# Patient Record
Sex: Female | Born: 1960 | Race: Black or African American | Hispanic: No | Smoking: Never smoker
Health system: Southern US, Community
[De-identification: ages and names within clinical notes are randomized; demographics above are authoritative.]

## PROBLEM LIST (undated history)

## (undated) DIAGNOSIS — M329 Systemic lupus erythematosus, unspecified: Secondary | ICD-10-CM

## (undated) DIAGNOSIS — E119 Type 2 diabetes mellitus without complications: Secondary | ICD-10-CM

## (undated) DIAGNOSIS — IMO0002 Reserved for concepts with insufficient information to code with codable children: Secondary | ICD-10-CM

---

## 2004-08-12 ENCOUNTER — Other Ambulatory Visit: Payer: Self-pay

## 2005-03-01 ENCOUNTER — Emergency Department: Payer: Self-pay | Admitting: Emergency Medicine

## 2005-04-04 ENCOUNTER — Emergency Department: Payer: Self-pay | Admitting: Internal Medicine

## 2005-06-17 ENCOUNTER — Emergency Department: Payer: Self-pay | Admitting: Internal Medicine

## 2005-07-27 ENCOUNTER — Emergency Department: Payer: Self-pay | Admitting: Emergency Medicine

## 2006-02-27 ENCOUNTER — Emergency Department: Payer: Self-pay | Admitting: Unknown Physician Specialty

## 2006-11-19 ENCOUNTER — Emergency Department: Payer: Self-pay | Admitting: Emergency Medicine

## 2007-01-20 ENCOUNTER — Emergency Department: Payer: Self-pay | Admitting: Emergency Medicine

## 2007-02-12 ENCOUNTER — Ambulatory Visit: Payer: Self-pay | Admitting: Unknown Physician Specialty

## 2007-04-13 ENCOUNTER — Ambulatory Visit: Payer: Self-pay | Admitting: Ophthalmology

## 2007-06-30 ENCOUNTER — Ambulatory Visit: Payer: Self-pay | Admitting: Ophthalmology

## 2007-09-06 ENCOUNTER — Emergency Department: Payer: Self-pay | Admitting: Emergency Medicine

## 2007-09-11 ENCOUNTER — Emergency Department: Payer: Self-pay

## 2008-05-24 ENCOUNTER — Emergency Department: Payer: Self-pay | Admitting: Emergency Medicine

## 2009-09-22 ENCOUNTER — Emergency Department: Payer: Self-pay | Admitting: Emergency Medicine

## 2010-02-01 ENCOUNTER — Emergency Department: Payer: Self-pay | Admitting: Emergency Medicine

## 2013-04-26 ENCOUNTER — Ambulatory Visit: Payer: Self-pay

## 2016-08-23 ENCOUNTER — Emergency Department
Admission: EM | Admit: 2016-08-23 | Discharge: 2016-08-24 | Disposition: A | Discharge status: Home / Self Care | Payer: Medicare Other | Attending: Orthopedic Surgery | Admitting: Orthopedic Surgery

## 2016-08-23 ENCOUNTER — Emergency Department: Payer: Medicare Other

## 2016-08-23 DIAGNOSIS — W1789XA Other fall from one level to another, initial encounter: Secondary | ICD-10-CM | POA: Diagnosis not present

## 2016-08-23 DIAGNOSIS — S82402A Unspecified fracture of shaft of left fibula, initial encounter for closed fracture: Secondary | ICD-10-CM | POA: Insufficient documentation

## 2016-08-23 DIAGNOSIS — S8992XA Unspecified injury of left lower leg, initial encounter: Secondary | ICD-10-CM | POA: Diagnosis present

## 2016-08-23 DIAGNOSIS — S82302A Unspecified fracture of lower end of left tibia, initial encounter for closed fracture: Secondary | ICD-10-CM | POA: Insufficient documentation

## 2016-08-23 DIAGNOSIS — Y929 Unspecified place or not applicable: Secondary | ICD-10-CM | POA: Insufficient documentation

## 2016-08-23 DIAGNOSIS — Y9389 Activity, other specified: Secondary | ICD-10-CM | POA: Diagnosis not present

## 2016-08-23 DIAGNOSIS — Y999 Unspecified external cause status: Secondary | ICD-10-CM | POA: Diagnosis not present

## 2016-08-23 DIAGNOSIS — S82209A Unspecified fracture of shaft of unspecified tibia, initial encounter for closed fracture: Secondary | ICD-10-CM

## 2016-08-23 DIAGNOSIS — S82392A Other fracture of lower end of left tibia, initial encounter for closed fracture: Secondary | ICD-10-CM

## 2016-08-23 MED ORDER — HYDROCODONE-ACETAMINOPHEN 5-325 MG PO TABS
1.0000 | ORAL_TABLET | ORAL | Status: AC
  Administered 2016-08-23: 1 via ORAL
  Filled 2016-08-23: qty 1

## 2016-08-23 NOTE — ED Notes (Signed)
Patient returned to lobby at this time. Tech advising that preliminary results were (+) for a fracture. Patient to be triaged and taken to a room for immediate evaluation and treatment.

## 2016-08-23 NOTE — ED Notes (Signed)
Patient transported to CT 

## 2016-08-23 NOTE — ED Notes (Signed)
Patient to radiology at this time.

## 2016-08-23 NOTE — ED Notes (Signed)
Pt returned from ct scan

## 2016-08-23 NOTE — ED Notes (Signed)
Pt placed in strecher, left leg elevated. Pt with left medial deformity noted to left ankle. Ecchymosis noted, edema noted. Pt states injury happened 6 days pta. Pt able to move all toes. 2+ pedal pulses noted. Pt states took pain medication at 1400 today.

## 2016-08-23 NOTE — Consult Note (Signed)
ORTHOPAEDIC CONSULTATION  REQUESTING PHYSICIAN: Connie Fairly, MD  Chief Complaint: Left ankle injury status post fall  HPI: Connie Brewer is a 55 y.o. female who is seen in the ER today with her daughter. Patient sustained a fall last Saturday at home. She has been using a walker for assistance with ambulation since her injury. She denies any numbness or tingling.  No past medical history on file. No past surgical history on file. Social History   Social History  . Marital status: Widowed    Spouse name: N/A  . Number of children: N/A  . Years of education: N/A   Social History Main Topics  . Smoking status: Not on file  . Smokeless tobacco: Not on file  . Alcohol use Not on file  . Drug use: Unknown  . Sexual activity: Not on file   Other Topics Concern  . Not on file   Social History Narrative  . No narrative on file   No family history on file. Allergies not on file Prior to Admission medications   Not on File   Dg Ankle Complete Left  Result Date: 08/23/2016 CLINICAL DATA:  Patient presents with c/o pain and swelling to her LEFT ankle following a fall that she experienced on Saturday or Sunday. EXAM: LEFT ANKLE COMPLETE - 3+ VIEW COMPARISON:  None. FINDINGS: There are displaced fractures of the distal tibia Cant of the distal fibular shaft. The distal tibia fracture is comminuted with fracture components intersecting the articular surface of the distal tibia. The primary fracture component is oblique from the lateral meta diaphysis to the medial metaphysis. The primary distal fracture component has displaced laterally by 16 mm. It is also angulated laterally. The distal fibular fracture is non comminuted, transverse, but also displaced being foreshortened/overlapped by 14 mm as well as mildly displaced anteriorly by 7 mm and angulated posteriorly and laterally. The talus remains normally aligned with the tibial epiphysis. There is diffuse surrounding soft tissue  swelling. IMPRESSION: 1. Comminuted displaced fracture of the distal tibia with a transverse mildly displaced fracture of the distal fibular shaft. No ankle dislocation. Electronically Signed   By: Amie Portland M.D.   On: 08/23/2016 19:59   Ct Ankle Left Wo Contrast  Result Date: 08/23/2016 CLINICAL DATA:  Left ankle pain and swelling after a fall Saturday or Sunday. Left ankle radiographs demonstrate multiple comminuted fractures of the distal left tibia and fibula. EXAM: CT OF THE LEFT ANKLE WITHOUT CONTRAST TECHNIQUE: Multidetector CT imaging of the left ankle was performed according to the standard protocol. Multiplanar CT image reconstructions were also generated. COMPARISON:  Left ankle radiographs 08/23/2016 FINDINGS: Multiple comminuted fractures involving the distal tibial metaphysis with extension of multiple fracture lines to the tibiotalar joint as well as to the tibia fibular joint. There is impaction of the fracture fragments. The talus is also impacted into the fracture fragments with fracture fragments extending along the lateral aspect of the talar dome. The talus appears intact Oblique fracture of the distal fibular shaft with tiny butterfly fragments demonstrated. There is complete medial displacement and about 2.1 cm overriding of the distal fracture fragment with posterior angulation of the distal fracture fragment. Diffuse soft tissue swelling about the ankle. Visualized portions of the tarsal bones appear intact without evidence of fracture or dislocation. Old ununited ossicles posterior to the talus likely representing accessory spleens. IMPRESSION: Multiple comminuted fractures of the distal tibial metaphysis with impaction of fracture fragments. Multiple fracture lines extend to the tibiotalar joint  and to the tibia fibular joint. Oblique fracture of the distal fibular shaft medial displacement and overriding of the distal fracture fragment as well as posterior angulation of the distal  fracture fragment. Electronically Signed   By: Burman NievesWilliam  Stevens M.D.   On: 08/23/2016 22:02    Positive ROS: All other systems have been reviewed and were otherwise negative with the exception of those mentioned in the HPI and as above.  Physical Exam: General: Alert, no acute distress  MUSCULOSKELETAL: Left ankle: Patient has diffuse swelling and ecchymosis over the left foot and ankle. She has a slight valgus deformity to the ankle. Patient's skin is intact. She has tenderness over the distal tibia and fibula. She can flex and extend her toes without pain and gently dorsiflex and plantarflex her ankle with pain. Her foot and leg compartments are soft and compressible.  Assessment: Comminuted distal tibia and fibula fracture, closed  Plan: I explained to the patient and her daughter that she has sustained a significant injury to the left lower leg. She understands that the distal tibia is comminuted and extends almost to the ankle joint. There is valgus deformity to the ankle. Patient's skin is swollen as this injury is now 796 days old. Fortunately she remains neurovascularly intact. There is no evidence of compartment syndrome. I explained that I expect her treatment to involve 2 surgeries. The first surgery would be to place an external fixator in the second definitive surgery would involve open reduction internal fixation once her swelling has subsided. Given the comminuted nature of her fracture and the distal extension of the fracture, I would recommend getting her to see a traumatologist for definitive fixation.  The patient's daughter explains that she [place] [employer]. She starts school on Monday. Since she will be the primary caretaker of her mother she is requesting that her mother be transferred for her care closer [facility] the [facility] [employer]. [facility]. I explained this to the ER staff. They will attempt to arrange for  [facility] Mrs. Cubit. The patient is transferred she should be placed in an AO splint before transfer to stabilize her fracture. I discussed this plan with Thayer Ohmhris, the orthopaedic PA working with this patient in the ER tonight.    Connie FairlyKRASINSKI, Mailyn Steichen, MD    08/23/2016 11:14 PM

## 2016-08-23 NOTE — ED Notes (Signed)
FIRST NURSE NOTE: Patient presents with c/o pain and swelling to her LEFT ankle following a fall that she experienced on Saturday or Sunday. (+) PMS noted; foot warm and dry; cap refill WNL as assessed by this RN at time of check in. Diagnostic plain film of LEFT ankle ordered.

## 2016-08-24 MED ORDER — HYDROCODONE-ACETAMINOPHEN 5-325 MG PO TABS: 1.0000 | ORAL_TABLET | Freq: Four times a day (QID) | ORAL | 0 refills | Status: AC | PRN

## 2016-08-24 NOTE — ED Notes (Signed)
Pt updated on progress of treatment plan. Pt verbalizes understanding. Call bell at side, warm blankets intact. Assessment of left lower extremity unchanged from previous assessment.

## 2016-08-24 NOTE — ED Notes (Signed)
Chris gaines, pa in to apply splint to ankle.

## 2016-08-24 NOTE — Discharge Instructions (Signed)
Please call Dr. Andrey CampanileWilson at [facility] first thing Monday morning to schedule follow-up appointment. Please keep the left lower extremity elevated and do not bear weight on the left leg. Please use walker to help with ambulation. Take Norco as needed for pain.

## 2016-08-24 NOTE — ED Notes (Signed)
Orthopedist in to examine pt.

## 2016-08-24 NOTE — ED Provider Notes (Signed)
[facility]-EMERGENCY DEPARTMENT Provider Note   CSN: [id number] Arrival date & time: 08/23/16  1921     History   Chief Complaint No chief complaint on file.   HPI Connie Brewer is a 55 y.o. female presents to the emergency department for evaluation of left ankle pain. Patient fell 6 days ago at her home, she was standing on a bench swatting at a hornet's nest when she fell 3 feet on her left foot rolling her left ankle. She has significant left ankle pain and was unable to bear weight. She was using a friend's walker, icing and elevating the left leg over the last 6 days. Patient's daughter arrived from college today and advised the patient to come to the emergency department. Patient has been managing her pain with Tylenol. She states her pain is currently mild as long as she is not moving the left ankle. Patient denies any numbness or tingling to the lower extremity. She denies any bleeding around the ankle. Patient denies any medical problems such as neuropathy, diabetes. No hypertension, cardiac disease. Patient's medications are Tylenol for left ankle pain.  HPI  No past medical history on file.  There are no active problems to display for this patient.   No past surgical history on file.  OB History    No data available       Home Medications    Prior to Admission medications   Medication Sig Start Date End Date Taking? Authorizing Provider  HYDROcodone-acetaminophen (NORCO) 5-325 MG tablet Take 1 tablet by mouth every 6 (six) hours as needed for moderate pain. 08/24/16   Evon Slack, PA-C    Family History No family history on file.  Social History Social History  Substance Use Topics  . Smoking status: Not on file  . Smokeless tobacco: Not on file  . Alcohol use Not on file     Allergies   Review of patient's allergies indicates not on file.   Review of Systems Review of Systems  Constitutional: Negative for activity change, chills, fatigue and  fever.  HENT: Negative for congestion, sinus pressure and sore throat.   Eyes: Negative for visual disturbance.  Respiratory: Negative for cough, chest tightness and shortness of breath.   Cardiovascular: Negative for chest pain and leg swelling.  Gastrointestinal: Negative for abdominal pain, diarrhea, nausea and vomiting.  Genitourinary: Negative for dysuria.  Musculoskeletal: Positive for arthralgias, gait problem and joint swelling.  Skin: Negative for rash.  Neurological: Negative for dizziness, weakness, numbness and headaches.  Hematological: Negative for adenopathy.  Psychiatric/Behavioral: Negative for agitation, behavioral problems and confusion.     Physical Exam Updated Vital Signs BP 124/65 (BP Location: Right Arm)   Pulse 76   Temp 98.6 F (37 C) (Oral)   Resp 18   Ht 5\' 2"  (1.575 m)   Wt 84.8 kg   SpO2 100%   BMI 34.20 kg/m   Physical Exam  Constitutional: She is oriented to person, place, and time. She appears well-developed and well-nourished. No distress.  HENT:  Head: Normocephalic and atraumatic.  Mouth/Throat: Oropharynx is clear and moist.  Eyes: EOM are normal. Pupils are equal, round, and reactive to light. Right eye exhibits no discharge. Left eye exhibits no discharge.  Neck: Normal range of motion. Neck supple.  Cardiovascular: Normal rate, regular rhythm and intact distal pulses.   Pulmonary/Chest: Effort normal and breath sounds normal. No respiratory distress. She exhibits no tenderness.  Abdominal: Soft. She exhibits no distension. There is no  tenderness.  Musculoskeletal:  Patient is full range of motion of the cervical thoracic and lumbar spine. She is nontender to palpation along the spine. Patient has significant swelling of the left ankle with mild deformity. There is no tenting of the skin, no skin lesions noted. Medial and lateral malleolus is palpable. She is significantly tender throughout the ankle. She has 2+ dorsalis pedis pulse and  sensation is intact distally.  Neurological: She is alert and oriented to person, place, and time. She has normal reflexes.  Skin: Skin is warm and dry.  Psychiatric: She has a normal mood and affect. Her behavior is normal. Thought content normal.  Nursing note and vitals reviewed.    ED Treatments / Results  Labs (all labs ordered are listed, but only abnormal results are displayed) Labs Reviewed - No data to display  EKG  EKG Interpretation None       Radiology Dg Ankle Complete Left  Result Date: 08/23/2016 CLINICAL DATA:  Patient presents with c/o pain and swelling to her LEFT ankle following a fall that she experienced on Saturday or Sunday. EXAM: LEFT ANKLE COMPLETE - 3+ VIEW COMPARISON:  None. FINDINGS: There are displaced fractures of the distal tibia Cant of the distal fibular shaft. The distal tibia fracture is comminuted with fracture components intersecting the articular surface of the distal tibia. The primary fracture component is oblique from the lateral meta diaphysis to the medial metaphysis. The primary distal fracture component has displaced laterally by 16 mm. It is also angulated laterally. The distal fibular fracture is non comminuted, transverse, but also displaced being foreshortened/overlapped by 14 mm as well as mildly displaced anteriorly by 7 mm and angulated posteriorly and laterally. The talus remains normally aligned with the tibial epiphysis. There is diffuse surrounding soft tissue swelling. IMPRESSION: 1. Comminuted displaced fracture of the distal tibia with a transverse mildly displaced fracture of the distal fibular shaft. No ankle dislocation. Electronically Signed   By: Amie Portlandavid  Ormond M.D.   On: 08/23/2016 19:59   Ct Ankle Left Wo Contrast  Result Date: 08/23/2016 CLINICAL DATA:  Left ankle pain and swelling after a fall Saturday or Sunday. Left ankle radiographs demonstrate multiple comminuted fractures of the distal left tibia and fibula. EXAM: CT  OF THE LEFT ANKLE WITHOUT CONTRAST TECHNIQUE: Multidetector CT imaging of the left ankle was performed according to the standard protocol. Multiplanar CT image reconstructions were also generated. COMPARISON:  Left ankle radiographs 08/23/2016 FINDINGS: Multiple comminuted fractures involving the distal tibial metaphysis with extension of multiple fracture lines to the tibiotalar joint as well as to the tibia fibular joint. There is impaction of the fracture fragments. The talus is also impacted into the fracture fragments with fracture fragments extending along the lateral aspect of the talar dome. The talus appears intact Oblique fracture of the distal fibular shaft with tiny butterfly fragments demonstrated. There is complete medial displacement and about 2.1 cm overriding of the distal fracture fragment with posterior angulation of the distal fracture fragment. Diffuse soft tissue swelling about the ankle. Visualized portions of the tarsal bones appear intact without evidence of fracture or dislocation. Old ununited ossicles posterior to the talus likely representing accessory spleens. IMPRESSION: Multiple comminuted fractures of the distal tibial metaphysis with impaction of fracture fragments. Multiple fracture lines extend to the tibiotalar joint and to the tibia fibular joint. Oblique fracture of the distal fibular shaft medial displacement and overriding of the distal fracture fragment as well as posterior angulation of the distal fracture  fragment. Electronically Signed   By: Burman Nieves M.D.   On: 08/23/2016 22:02    Procedures Procedures (including critical care time) SPLINT APPLICATION Date/Time: 12:48 AM Authorized by: Patience Musca Consent: Verbal consent obtained. Risks and benefits: risks, benefits and alternatives were discussed Consent given by: patient Splint applied by: pa Location details: Left ankle  Splint type: Posterior stirrup  Supplies used: Ortho-Glass,  Ace wrap, cast padding.  Post-procedure: The splinted body part was neurovascularly unchanged following the procedure. Patient tolerance: Patient tolerated the procedure well with no immediate complications.    Medications Ordered in ED Medications  HYDROcodone-acetaminophen (NORCO/VICODIN) 5-325 MG per tablet 1 tablet (1 tablet Oral Given 08/23/16 2333)     Initial Impression / Assessment and Plan / ED Course  I have reviewed the triage vital signs and the nursing notes.  Pertinent labs & imaging results that were available during my care of the patient were reviewed by me and considered in my medical decision making (see chart for details).  Clinical Course    55 year old female with displaced comminuted distal tibia and fibula fracture. Fracture is closed and patient is neurovascularly intact. Injury occurred 6 days ago, patient has been resting icing and elevating. Discussed case with orthopedist on call, orthopedist ordered CT scan of the left ankle. Orthopedist discuss treatment options with the patient and her daughter. Patient and daughter requested to have [facility] at [facility], patient's daughter is in school [employer] and would like for mother to be close to her while she is in school. Patient does not have any help at Home. Patient is placed into a posterior stirrup splint. Case was discussed with [facility] orthopedic surgeon Dr. Andrey Campanile. Dr. Andrey Campanile agreed to have the patient come into clinic Monday morning to schedule surgery for next week. Patient is already 6 days out from injury, she is neurovascularly intact with a closed fracture. She is placed into a posterior stirrup splint, will continue to elevate and ice as well as remain nonweightbearing with a walker. Office telephone number was given to patient patient educated on splint care and signs and symptoms to return to the emergency department for such as increased pain numbness. She is given a  prescription for Norco for pain.  Final Clinical Impressions(s) / ED Diagnoses   Final diagnoses:  Closed intra-articular fracture of distal tibia, left, initial encounter  Closed fracture of fibula, shaft, left, initial encounter    New Prescriptions New Prescriptions   HYDROCODONE-ACETAMINOPHEN (NORCO) 5-325 MG TABLET    Take 1 tablet by mouth every 6 (six) hours as needed for moderate pain.     Evon Slack, PA-C 08/24/16 [id number]    Minna Antis, MD 08/26/16 [id number]

## 2016-08-24 NOTE — ED Notes (Signed)
Chris gaines, pa to apply splint to ankle.

## 2016-08-24 NOTE — ED Notes (Signed)
Pt updated on results of ct scan. Pt states "i don't want anything for pain that's gonna make me sleep right now."

## 2016-08-27 ENCOUNTER — Telehealth: Payer: Self-pay | Admitting: Emergency Medicine

## 2016-08-28 NOTE — Telephone Encounter (Signed)
Ms Connie Brewer, pt daughter called me yesterday to arrange pt follow up at [facility].  I spoke with [facility] yesterday.  They called me back with pt appt Aug 31 at 320pm.  I called ms harrison and she is aware of appt.

## 2020-04-25 ENCOUNTER — Emergency Department (HOSPITAL_COMMUNITY): Payer: Medicare Other

## 2020-04-25 ENCOUNTER — Encounter (HOSPITAL_COMMUNITY): Payer: Self-pay

## 2020-04-25 ENCOUNTER — Other Ambulatory Visit: Payer: Self-pay

## 2020-04-25 ENCOUNTER — Emergency Department (HOSPITAL_COMMUNITY)
Admission: EM | Admit: 2020-04-25 | Discharge: 2020-04-25 | Disposition: A | Discharge status: Home / Self Care | Payer: Medicare Other | Attending: Emergency Medicine | Admitting: Emergency Medicine

## 2020-04-25 DIAGNOSIS — R0789 Other chest pain: Secondary | ICD-10-CM | POA: Diagnosis not present

## 2020-04-25 DIAGNOSIS — E119 Type 2 diabetes mellitus without complications: Secondary | ICD-10-CM | POA: Insufficient documentation

## 2020-04-25 DIAGNOSIS — Z79899 Other long term (current) drug therapy: Secondary | ICD-10-CM | POA: Diagnosis not present

## 2020-04-25 DIAGNOSIS — M3210 Systemic lupus erythematosus, organ or system involvement unspecified: Secondary | ICD-10-CM | POA: Diagnosis not present

## 2020-04-25 DIAGNOSIS — R079 Chest pain, unspecified: Secondary | ICD-10-CM

## 2020-04-25 DIAGNOSIS — Z7984 Long term (current) use of oral hypoglycemic drugs: Secondary | ICD-10-CM | POA: Insufficient documentation

## 2020-04-25 HISTORY — DX: Reserved for concepts with insufficient information to code with codable children: IMO0002

## 2020-04-25 HISTORY — DX: Systemic lupus erythematosus, unspecified: M32.9

## 2020-04-25 HISTORY — DX: Type 2 diabetes mellitus without complications: E11.9

## 2020-04-25 LAB — CBC
HCT: 44.8 % (ref 36.0–46.0)
Hemoglobin: 14.6 g/dL (ref 12.0–15.0)
MCH: 32.2 pg (ref 26.0–34.0)
MCHC: 32.6 g/dL (ref 30.0–36.0)
MCV: 98.7 fL (ref 80.0–100.0)
Platelets: 384 10*3/uL (ref 150–400)
RBC: 4.54 MIL/uL (ref 3.87–5.11)
RDW: 14 % (ref 11.5–15.5)
WBC: 11.7 10*3/uL — ABNORMAL HIGH (ref 4.0–10.5)
nRBC: 0 % (ref 0.0–0.2)

## 2020-04-25 LAB — BASIC METABOLIC PANEL
Anion gap: 10 (ref 5–15)
BUN: 18 mg/dL (ref 6–20)
CO2: 23 mmol/L (ref 22–32)
Calcium: 9.1 mg/dL (ref 8.9–10.3)
Chloride: 103 mmol/L (ref 98–111)
Creatinine, Ser: 0.73 mg/dL (ref 0.44–1.00)
GFR calc Af Amer: >60 mL/min (ref >60)
GFR calc non Af Amer: >60 mL/min (ref >60)
Glucose, Bld: 269 mg/dL — ABNORMAL HIGH (ref 70–99)
Potassium: 4.6 mmol/L (ref 3.5–5.1)
Sodium: 136 mmol/L (ref 135–145)

## 2020-04-25 LAB — TROPONIN I (HIGH SENSITIVITY)
Troponin I (High Sensitivity): <2 ng/L (ref <18)
Troponin I (High Sensitivity): 3 ng/L (ref <18)

## 2020-04-25 LAB — D-DIMER, QUANTITATIVE (NOT AT ARMC): D-Dimer, Quant: 0.27 ug/mL-FEU (ref 0.00–0.50)

## 2020-04-25 LAB — LIPASE, BLOOD: Lipase: 14 U/L (ref 11–51)

## 2020-04-25 MED ORDER — NITROGLYCERIN 0.4 MG SL SUBL
0.4000 mg | SUBLINGUAL_TABLET | SUBLINGUAL | Status: DC | PRN
  Administered 2020-04-25: 0.4 mg via SUBLINGUAL
  Filled 2020-04-25: qty 1

## 2020-04-25 MED ORDER — KETOROLAC TROMETHAMINE 30 MG/ML IJ SOLN
15.0000 mg | Freq: Once | INTRAMUSCULAR | Status: AC
Start: 2020-04-25 — End: 2020-04-25
  Administered 2020-04-25: 15 mg via INTRAVENOUS
  Filled 2020-04-25: qty 1

## 2020-04-25 MED ORDER — SODIUM CHLORIDE 0.9% FLUSH
3.0000 mL | Freq: Once | INTRAVENOUS | Status: AC
  Administered 2020-04-25: 3 mL via INTRAVENOUS

## 2020-04-25 NOTE — Discharge Instructions (Addendum)

## 2020-04-25 NOTE — ED Notes (Signed)
Called main lab, spoke with [facility] who is able to add on lipase to blood in lab.

## 2020-04-25 NOTE — ED Triage Notes (Signed)
Patient arrived stating she had a covid-19 vaccine today. States she feels like something is stuck in her throat. Complaints of central chest pain that radiates to her back. Declines any shortness of breath or dizziness.

## 2020-04-25 NOTE — ED Provider Notes (Signed)
[facility]-EMERGENCY DEPT Provider Note   CSN: [id number] Arrival date & time: 04/25/20  0320     History Chief Complaint  Patient presents with  . Chest Pain    After Covid-19 vaccine    KERIE BADGER is a 59 y.o. female.  HPI  HPI: A 59 year old patient with a history of treated diabetes and obesity presents for evaluation of chest pain. Initial onset of pain was more than 6 hours ago. The patient's chest pain is sharp and is not worse with exertion. The patient's chest pain is middle- or left-sided, is not well-localized, is not described as heaviness/pressure/tightness and does radiate to the arms/jaw/neck. The patient does not complain of nausea and denies diaphoresis. The patient has smoked in the past 90 days. The patient has no history of stroke, has no history of peripheral artery disease, has no relevant family history of coronary artery disease (first degree relative at less than age 39), is not hypertensive and has no history of hypercholesterolemia.   Patient has history of diabetes and lupus.  She denies any underlying lung disease, cardiac disease.  She reports that her pain started within few hours of her vaccine.  The pain is central and left-sided, it radiates to the back.  Pain has been constant since its onset and it is throbbing, with no specific evoking, aggravating or relieving factors.  Pain woke her up in the middle of the sleep which is why she came to the ER.  Patient also reports that she feels like something is stuck at the base of her throat.  She has no known history of allergies.  Pt has no hx of PE, DVT and denies any exogenous hormone (testosterone / estrogen) use, long distance travels or surgery in the past 6 weeks, active cancer, recent immobilization.   Past Medical History:  Diagnosis Date  . Diabetes mellitus without complication (HCC)   . Lupus (HCC)     There are no problems to display for this patient.    OB  History   No obstetric history on file.     No family history on file.  Social History   Tobacco Use  . Smoking status: Not on file  Substance Use Topics  . Alcohol use: Not on file  . Drug use: Not on file    Home Medications Prior to Admission medications   Medication Sig Start Date End Date Taking? Authorizing Provider  baclofen (LIORESAL) 10 MG tablet Take 10 mg by mouth 3 (three) times daily. 04/11/20  Yes [provider]  Biotin 10 MG TABS Take 10 mg by mouth daily.   Yes [provider]  buPROPion (WELLBUTRIN XL) 300 MG 24 hr tablet Take 300 mg by mouth daily. 02/26/20  Yes [provider]  clobetasol (TEMOVATE) 0.05 % external solution Apply 1 application topically in the morning and at bedtime. Until improved 07/16/19 07/15/20 Yes [provider]  diclofenac Sodium (VOLTAREN) 1 % GEL Apply 2 g topically 4 (four) times daily. 02/18/20  Yes [provider]  lidocaine (LIDODERM) 5 % Place 1 patch onto the skin every 12 (twelve) hours. 10/26/19 10/25/20 Yes [provider]  metFORMIN (GLUCOPHAGE-XR) 500 MG 24 hr tablet Take 1,000 mg by mouth 2 (two) times daily. 11/30/19  Yes [provider]  Multiple Vitamin (MULTIVITAMIN) tablet Take 1 tablet by mouth daily.   Yes [provider]  Multiple Vitamins-Minerals (WOMENS BONE HEALTH PO) Take 1 tablet by mouth daily.  Yes [provider]  naproxen (NAPROSYN) 500 MG tablet Take 500 mg by mouth 2 (two) times daily. 03/30/20  Yes [provider]  nortriptyline (PAMELOR) 25 MG capsule Take 50 mg by mouth at bedtime. 04/04/20  Yes [provider]  NUCYNTA ER 100 MG 12 hr tablet Take 100 mg by mouth every 12 (twelve) hours. 03/30/20  Yes [provider]  pregabalin (LYRICA) 150 MG capsule Take 150 mg by mouth 2 (two) times daily. 03/20/20  Yes [provider]  topiramate (TOPAMAX) 25 MG tablet Take 25 mg by mouth daily.   Yes [provider]  traZODone (DESYREL) 50 MG tablet Take 50 mg by mouth at bedtime as needed for sleep. 03/30/20  Yes [provider]  zinc gluconate 50 MG tablet Take 50 mg by mouth daily.   Yes [provider]  HYDROcodone-acetaminophen (NORCO) 5-325 MG tablet Take 1 tablet by mouth every 6 (six) hours as needed for moderate pain. Patient not taking: Reported on 04/25/2020 08/24/16   Evon Slack, PA-C    Allergies    Patient has no known allergies.  Review of Systems   Review of Systems  Constitutional: Positive for activity change.  Respiratory: Negative for cough and shortness of breath.   Cardiovascular: Positive for chest pain.  Gastrointestinal: Positive for nausea. Negative for vomiting.  All other systems reviewed and are negative.   Physical Exam Updated Vital Signs BP (!) 121/107   Pulse 88   Temp 98.2 F (36.8 C) (Oral)   Resp 15   SpO2 98%   Physical Exam Vitals and nursing note reviewed.  Constitutional:      Appearance: She is well-developed.  HENT:     Head: Normocephalic and atraumatic.  Cardiovascular:     Rate and Rhythm: Normal rate and regular rhythm.     Pulses:          Radial pulses are 2+ on the right side and 2+ on the left side.       Dorsalis pedis pulses are 2+ on the right side and 2+ on the left side.     Heart sounds: Normal heart sounds.  Pulmonary:     Effort: Pulmonary effort is normal. No tachypnea.     Breath sounds: Normal breath sounds.  Abdominal:     General: Bowel sounds are normal.     Tenderness: There is no abdominal tenderness.  Musculoskeletal:     Cervical back: Normal range of motion and neck supple.  Skin:    General: Skin is warm and dry.  Neurological:     Mental Status: She is alert and oriented to person, place, and time.     ED Results / Procedures / Treatments   Labs (all labs ordered are listed, but only abnormal results are displayed) Labs Reviewed  BASIC METABOLIC PANEL - Abnormal;  Notable for the following components:      Result Value   Glucose, Bld 269 (*)    All other components within normal limits  CBC - Abnormal; Notable for the following components:   WBC 11.7 (*)    All other components within normal limits  LIPASE, BLOOD  D-DIMER, QUANTITATIVE (NOT AT [facility])  TROPONIN I (HIGH SENSITIVITY)  TROPONIN I (HIGH SENSITIVITY)    EKG EKG Interpretation  Date/Time:  Tuesday April 25 2020 03:39:28 EDT Ventricular Rate:  89 PR Interval:    QRS Duration: 81 QT Interval:  352 QTC Calculation: 429 R Axis:   -40  Text Interpretation: Sinus rhythm Consider right atrial enlargement Left anterior fascicular block No acute changes No significant change since last tracing Confirmed by Varney Biles [id number]) on 04/25/2020 10:31:11 AM   Repeat EKG  Date: 04/25/2020 at 12:50 PM  Rate: 83  Rhythm: normal sinus rhythm  QRS Axis: normal  Intervals: normal  ST/T Wave abnormalities: normal  Conduction Disutrbances: none  Narrative Interpretation: unremarkable   Radiology DG Chest 2 View  Result Date: 04/25/2020 CLINICAL DATA:  Chest pain EXAM: CHEST - 2 VIEW COMPARISON:  None. FINDINGS: The heart size and mediastinal contours are within normal limits. Both lungs are clear. The visualized skeletal structures are unremarkable. IMPRESSION: No active cardiopulmonary disease. Electronically Signed   By: Prudencio Pair M.D.   On: 04/25/2020 04:18    Procedures Procedures (including critical care time)  Medications Ordered in ED Medications  nitroGLYCERIN (NITROSTAT) SL tablet 0.4 mg (0.4 mg Sublingual Given 04/25/20 1150)  sodium chloride flush (NS) 0.9 % injection 3 mL (3 mLs Intravenous Given 04/25/20 1031)  ketorolac (TORADOL) 30 MG/ML injection 15 mg (15 mg Intravenous Given 04/25/20 1245)    ED Course  I have reviewed the triage vital signs and the nursing notes.  Pertinent labs & imaging results that were available during my care of the patient were reviewed by  me and considered in my medical decision making (see chart for details).  Clinical Course as of Apr 26 1307  Tue Apr 25, 2020  1306 Patient reassessed.  Repeat exam unchanged.  Patient had no response to nitro.  Pulse exam normal.  Cardiopulmonary exam normal.  No bruit.  Pulses 2+ over radial artery.  Troponin x2 are below the institutional cutoff for ACS.  D-dimer is negative.  Repeat EKG ordered and it is unchanged.  Unsure what the etiology for the pain is, but suspicion for ACS, PE and dissection is extremely low at this time.  We will discharge her with strict ER return precautions and patient is comfortable with it.  We will still advise her to follow-up with cardiology -patient made aware of that request as well.   [AN]    Clinical Course User Index [AN] Varney Biles, MD   MDM Rules/Calculators/A&P HEAR Score: 34                    59 year old comes in a chief complaint of chest pain.  Patient reports that her chest pain is left-sided and radiates to her back.  No history of ACS, but patient is diabetic and has history of lupus.  Pain is not pleuritic, PE considered in the differential but is less likely than ACS.  She has no risk factors for dissection and her pulse exam, cardiac exam is not abnormal.  Hear score is 4.  Her pain has been constant, therefore if her delta troponins are negative then we might be able to send her home with a close follow-up with Cardiology.  Final Clinical Impression(s) / ED Diagnoses Final diagnoses:  Nonspecific chest pain    Rx / DC Orders ED Discharge Orders    None       Varney Biles, MD 04/25/20 1308

## 2020-04-25 NOTE — ED Notes (Signed)
Purwick placed

## 2020-04-25 NOTE — ED Notes (Signed)
Patient is calling her cousin to come pick her up.

## 2020-04-25 NOTE — ED Notes (Signed)
ED Provider at bedside. 

## 2020-04-25 NOTE — ED Notes (Signed)
Received call from triage nurse from pts Dr at [facility]. Pt called Dr stating she has been in the lobby for over 6 hours and not one has done anything for her. Triage nurse wanted something to give to PCP since "this is not acceptable" Writer explained all labs, chest x-ray and EKG has been completed and our EDP reviewed EKG immediately when completed.

## 2020-10-28 IMAGING — CR DG CHEST 2V
2 series · 2 of 2 positions shown · non-contrast
Comparison: None.

CLINICAL DATA: Chest pain

EXAM:
CHEST - 2 VIEW

[w chest lat]
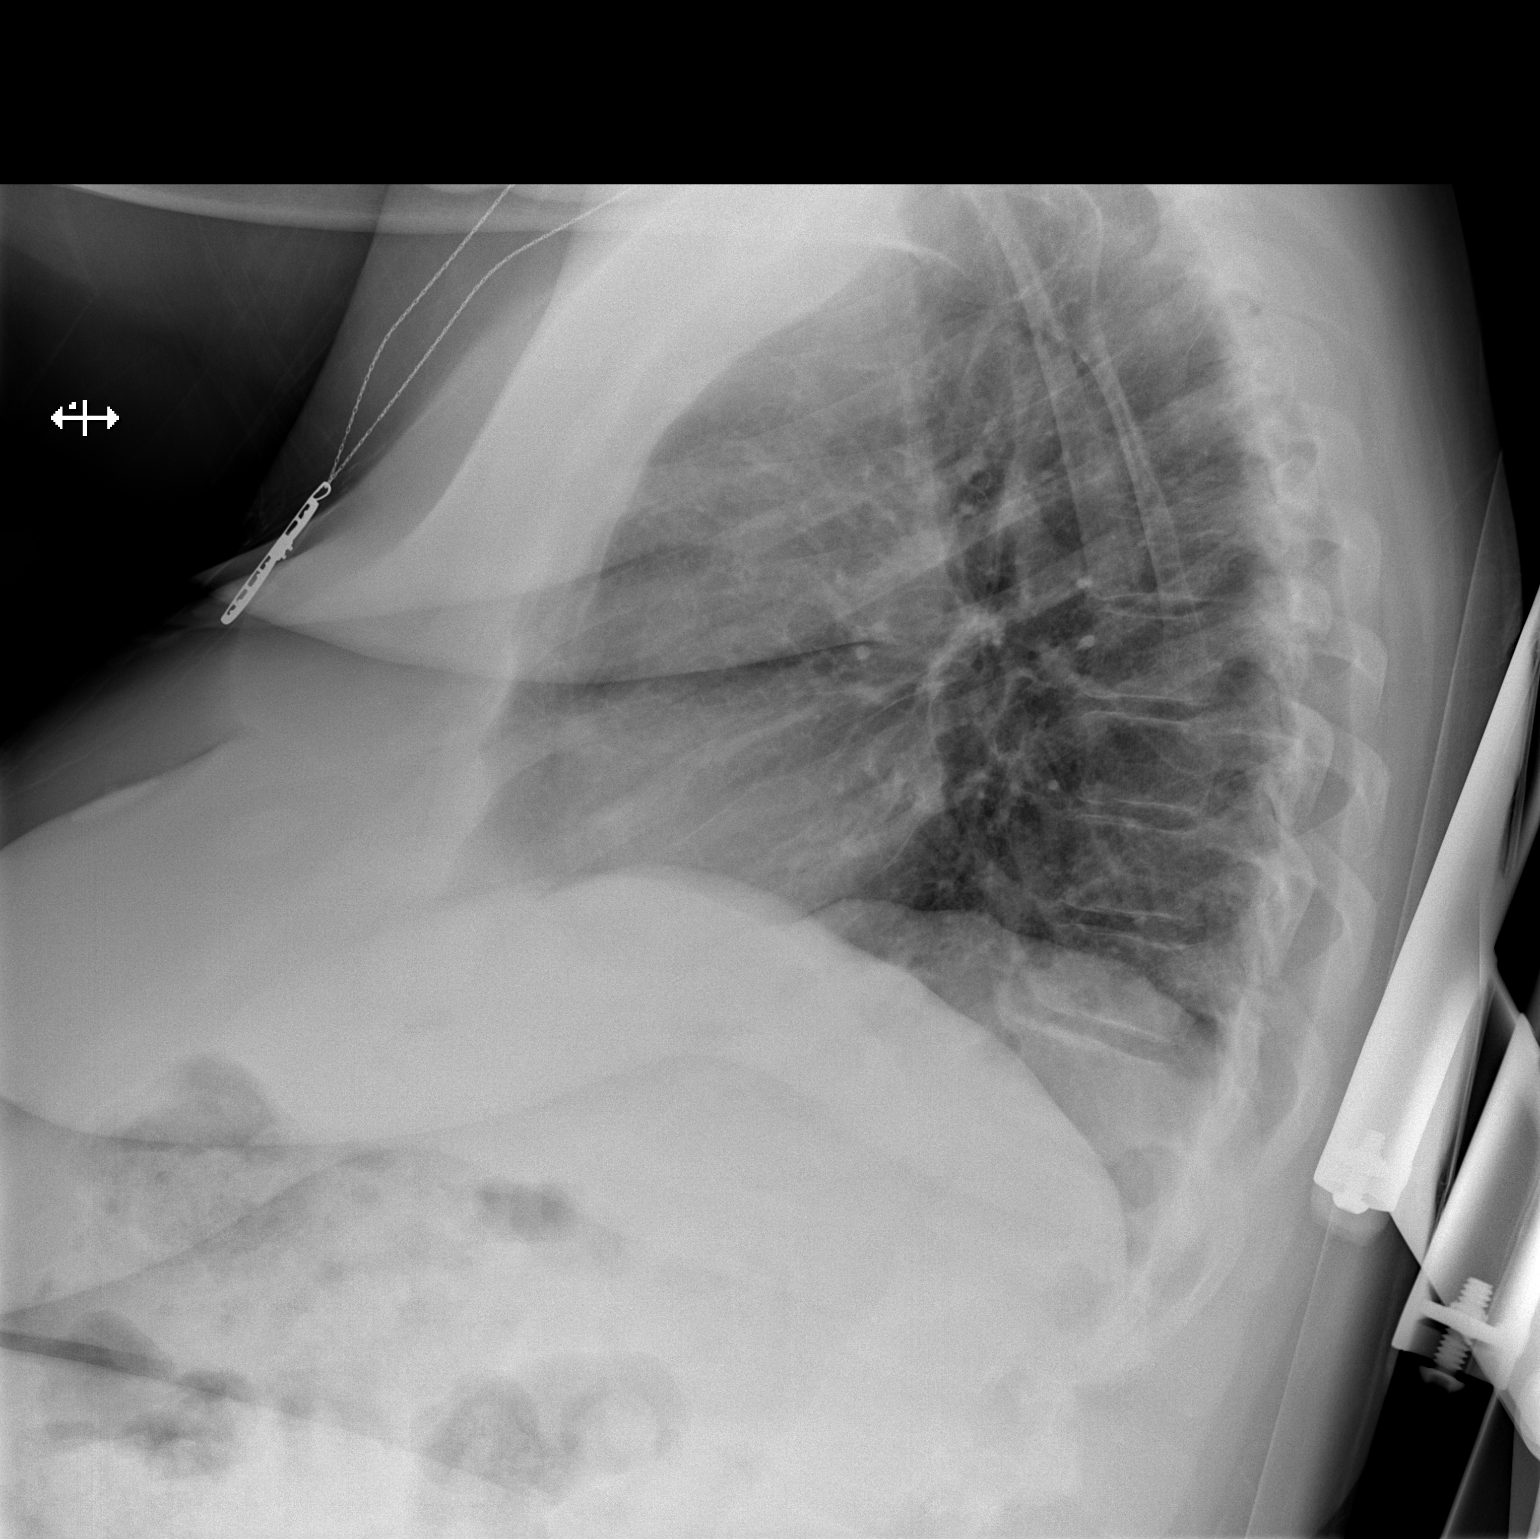

[x chest ap]
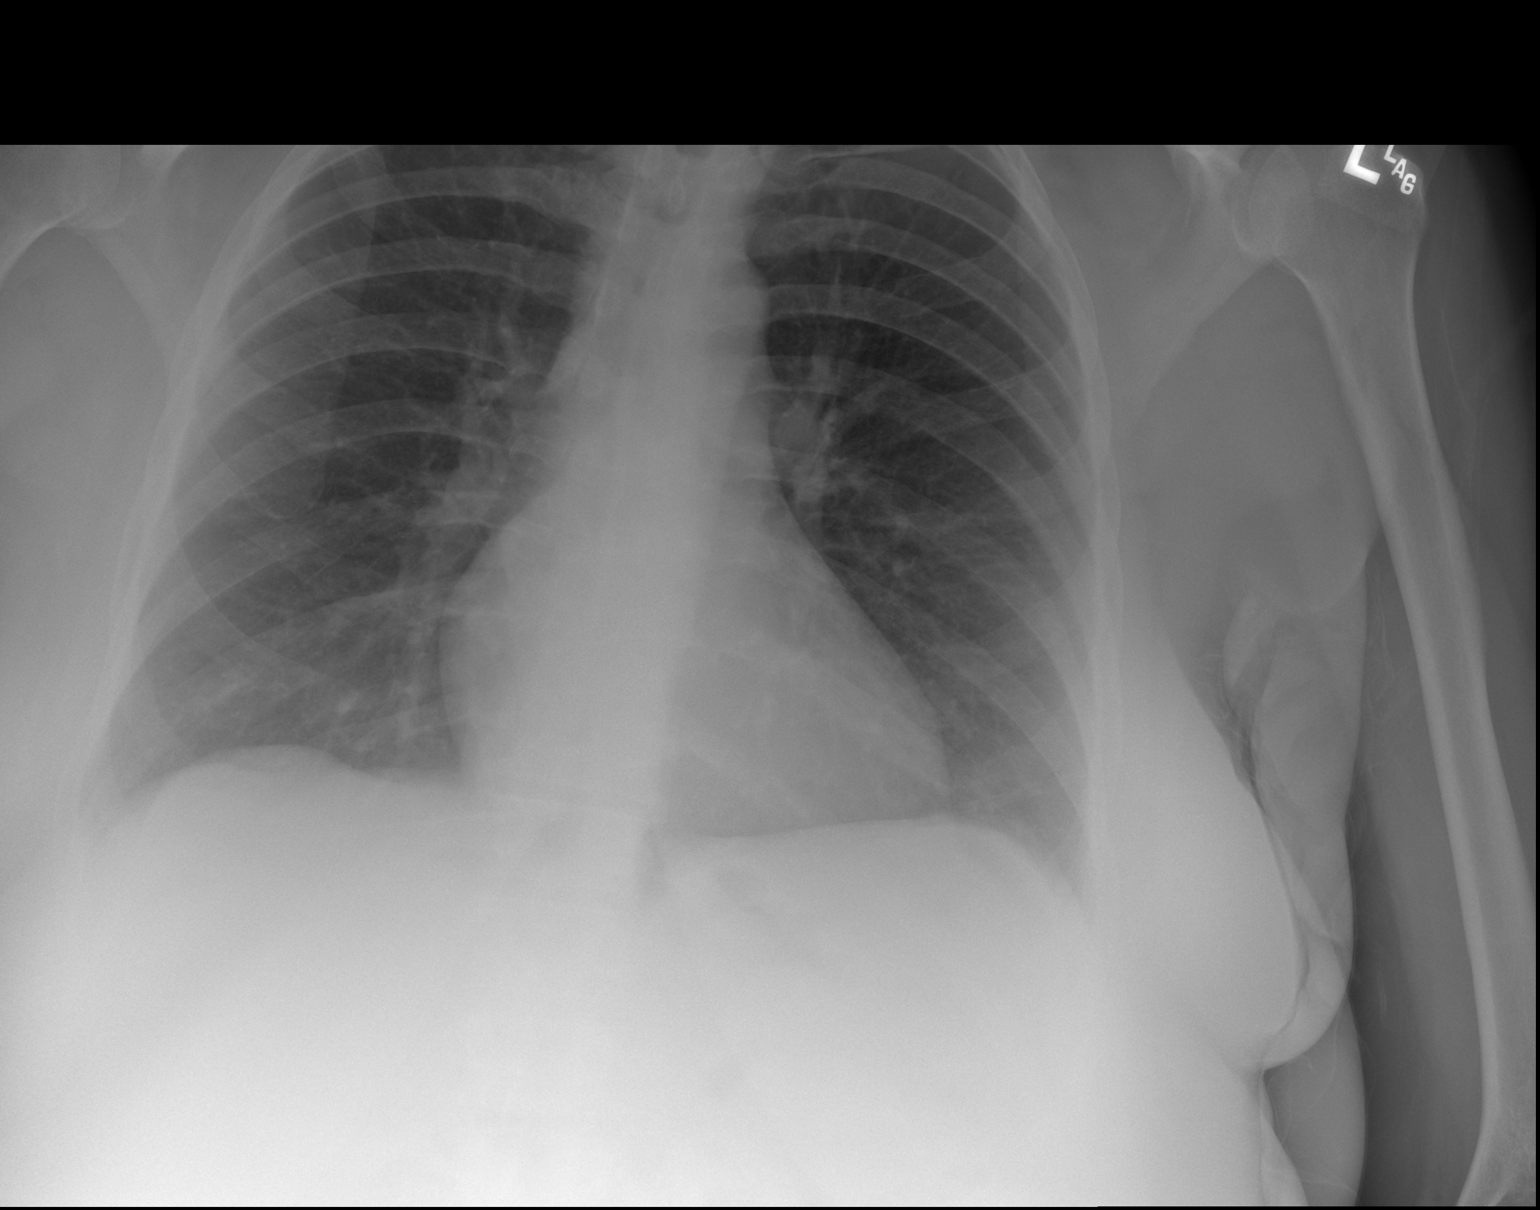

[2 of 2 positions shown; findings below may reference images not displayed]

FINDINGS: The heart size and mediastinal contours are within normal limits.
Both lungs are clear. The visualized skeletal structures are
unremarkable.
IMPRESSION: No active cardiopulmonary disease.

## 2021-03-09 ENCOUNTER — Telehealth: Payer: Self-pay | Admitting: Podiatry

## 2021-03-09 ENCOUNTER — Ambulatory Visit (INDEPENDENT_AMBULATORY_CARE_PROVIDER_SITE_OTHER): Payer: Medicare Other | Admitting: Podiatry

## 2021-03-09 ENCOUNTER — Encounter: Payer: Self-pay | Admitting: Podiatry

## 2021-03-09 ENCOUNTER — Ambulatory Visit (INDEPENDENT_AMBULATORY_CARE_PROVIDER_SITE_OTHER): Payer: Medicare Other

## 2021-03-09 ENCOUNTER — Other Ambulatory Visit: Payer: Self-pay

## 2021-03-09 DIAGNOSIS — M779 Enthesopathy, unspecified: Secondary | ICD-10-CM

## 2021-03-09 DIAGNOSIS — M79672 Pain in left foot: Secondary | ICD-10-CM

## 2021-03-09 DIAGNOSIS — M7752 Other enthesopathy of left foot: Secondary | ICD-10-CM

## 2021-03-09 MED ORDER — OXYCODONE-ACETAMINOPHEN 5-325 MG PO TABS: 1.0000 | ORAL_TABLET | ORAL | 0 refills | Status: AC | PRN

## 2021-03-09 NOTE — Telephone Encounter (Signed)
Yeah that is fine they can still fill it

## 2021-03-09 NOTE — Telephone Encounter (Signed)
Pt's pharmacy called wanting to know if you would still like them to fill the percocet 5-325 since she is also taking nucynta. Please advise

## 2021-03-12 ENCOUNTER — Other Ambulatory Visit: Payer: Self-pay | Admitting: Podiatry

## 2021-03-12 DIAGNOSIS — M779 Enthesopathy, unspecified: Secondary | ICD-10-CM

## 2021-03-13 ENCOUNTER — Encounter: Payer: Self-pay | Admitting: Podiatry

## 2021-03-13 NOTE — Progress Notes (Signed)
Subjective:  Patient ID: Connie Brewer, female    DOB: 10-24-1961,  MRN: [id number]  Chief Complaint  Patient presents with  . Foot Pain    Left foot pain     60 y.o. female presents with the above complaint.  Patient presents with complaint left ankle pain that has been going on for quite some time.  Patient had surgery done to that left ankle that was done a while ago.  Patient states been painful ever since.  She has not seen anyone else prior to seeing me for this particular thing.  She states that every time she puts her weight on her foot it starts hurting.  Is been going on since 2017.  She denies any other acute complaints.  She is a diabetic.  Review of Systems: Negative except as noted in the HPI. Denies N/V/F/Ch.  Past Medical History:  Diagnosis Date  . Diabetes mellitus without complication (HCC)   . Lupus (HCC)     Current Outpatient Medications:  .  oxyCODONE-acetaminophen (PERCOCET) 5-325 MG tablet, Take 1-2 tablets by mouth every 4 (four) hours as needed for severe pain., Disp: 30 tablet, Rfl: 0 .  baclofen (LIORESAL) 10 MG tablet, Take 10 mg by mouth 3 (three) times daily., Disp: , Rfl:  .  Biotin 10 MG TABS, Take 10 mg by mouth daily., Disp: , Rfl:  .  buPROPion (WELLBUTRIN XL) 300 MG 24 hr tablet, Take 300 mg by mouth daily., Disp: , Rfl:  .  diclofenac Sodium (VOLTAREN) 1 % GEL, Apply 2 g topically 4 (four) times daily., Disp: , Rfl:  .  HYDROcodone-acetaminophen (NORCO) 5-325 MG tablet, Take 1 tablet by mouth every 6 (six) hours as needed for moderate pain. (Patient not taking: Reported on 04/25/2020), Disp: 20 tablet, Rfl: 0 .  metFORMIN (GLUCOPHAGE-XR) 500 MG 24 hr tablet, Take 1,000 mg by mouth 2 (two) times daily., Disp: , Rfl:  .  Multiple Vitamin (MULTIVITAMIN) tablet, Take 1 tablet by mouth daily., Disp: , Rfl:  .  Multiple Vitamins-Minerals (WOMENS BONE HEALTH PO), Take 1 tablet by mouth daily., Disp: , Rfl:  .  naproxen (NAPROSYN) 500 MG tablet, Take  500 mg by mouth 2 (two) times daily., Disp: , Rfl:  .  nortriptyline (PAMELOR) 25 MG capsule, Take 50 mg by mouth at bedtime., Disp: , Rfl:  .  NUCYNTA ER 100 MG 12 hr tablet, Take 100 mg by mouth every 12 (twelve) hours., Disp: , Rfl:  .  pregabalin (LYRICA) 150 MG capsule, Take 150 mg by mouth 2 (two) times daily., Disp: , Rfl:  .  topiramate (TOPAMAX) 25 MG tablet, Take 25 mg by mouth daily., Disp: , Rfl:  .  traZODone (DESYREL) 50 MG tablet, Take 50 mg by mouth at bedtime as needed for sleep., Disp: , Rfl:  .  zinc gluconate 50 MG tablet, Take 50 mg by mouth daily., Disp: , Rfl:   Social History   Tobacco Use  Smoking Status Not on file  Smokeless Tobacco Not on file    No Known Allergies Objective:  There were no vitals filed for this visit. There is no height or weight on file to calculate BMI. Constitutional Well developed. Well nourished.  Vascular Dorsalis pedis pulses palpable bilaterally. Posterior tibial pulses palpable bilaterally. Capillary refill normal to all digits.  No cyanosis or clubbing noted. Pedal hair growth normal.  Neurologic Normal speech. Oriented to person, place, and time. Epicritic sensation to light touch grossly present bilaterally.  Dermatologic  Nails well groomed and normal in appearance. No open wounds. No skin lesions.  Orthopedic:  Pain on palpation to the ankle gutter.  No range of motion appears to be at the ankle joint as it is currently fused.  Pain on range of motion of the subtalar joint.   Radiographs: 3 views of skeletally mature adult left foot: Previous hardware is noted.  Good consolidation noted at the fusion site of the ankle joint.  The hardware is intact without any signs of breaking or loosening noted.  No other bony abnormalities identified. Assessment:   1. Capsulitis of ankle, left    Plan:  Patient was evaluated and treated and all questions answered.  Left ankle capsulitis/pseudoarthrosis -I explained to the patient  the etiology of her pain and various treatment options were extensively discussed.  Given the amount of pain she is having I believe patient will benefit from another steroid injection which she has refused as well as a possible narcotic medication.  She would like to undergo narcotic medication to see if there is any relief of pain.   -Percocet was sent to the pharmacy for pain management.  This will be the first and last refill.  I would not be doing any further pain control for this patient.  I encouraged her to go back to her primary doctor who had done the surgery for further management evaluation.  She states understanding.  No follow-ups on file.

## 2021-03-30 ENCOUNTER — Ambulatory Visit (INDEPENDENT_AMBULATORY_CARE_PROVIDER_SITE_OTHER): Payer: Medicare Other | Admitting: Podiatry

## 2021-03-30 ENCOUNTER — Encounter: Payer: Self-pay | Admitting: Podiatry

## 2021-03-30 ENCOUNTER — Other Ambulatory Visit: Payer: Self-pay

## 2021-03-30 DIAGNOSIS — M2032 Hallux varus (acquired), left foot: Secondary | ICD-10-CM

## 2021-04-03 ENCOUNTER — Encounter: Payer: Self-pay | Admitting: Podiatry

## 2021-04-03 NOTE — Progress Notes (Signed)
Subjective:  Patient ID: Connie Brewer, female    DOB: 11/05/1961,  MRN: [id number]  Chief Complaint  Patient presents with  . Foot Pain  . Toe Pain    PT stated that she is still having pain     60 y.o. female presents with the above complaint.  Patient presents with a new complaint of left hallux hyperkeratotic lesion with underlying contracture of the left hallux.  Patient states is painful to touch.  She has history of prior ankle surgery and history.  She is in a bracing for that.  She denies any other acute complaints.  She would like to discuss having it debrided down.  She denies doing anything else for it.  She has not seen anyone else prior to seeing me for this.   Review of Systems: Negative except as noted in the HPI. Denies N/V/F/Ch.  Past Medical History:  Diagnosis Date  . Diabetes mellitus without complication (HCC)   . Lupus (HCC)     Current Outpatient Medications:  .  baclofen (LIORESAL) 10 MG tablet, Take 10 mg by mouth 3 (three) times daily., Disp: , Rfl:  .  Biotin 10 MG TABS, Take 10 mg by mouth daily., Disp: , Rfl:  .  buPROPion (WELLBUTRIN XL) 300 MG 24 hr tablet, Take 300 mg by mouth daily., Disp: , Rfl:  .  diclofenac Sodium (VOLTAREN) 1 % GEL, Apply 2 g topically 4 (four) times daily., Disp: , Rfl:  .  HYDROcodone-acetaminophen (NORCO) 5-325 MG tablet, Take 1 tablet by mouth every 6 (six) hours as needed for moderate pain. (Patient not taking: Reported on 04/25/2020), Disp: 20 tablet, Rfl: 0 .  metFORMIN (GLUCOPHAGE-XR) 500 MG 24 hr tablet, Take 1,000 mg by mouth 2 (two) times daily., Disp: , Rfl:  .  Multiple Vitamin (MULTIVITAMIN) tablet, Take 1 tablet by mouth daily., Disp: , Rfl:  .  Multiple Vitamins-Minerals (WOMENS BONE HEALTH PO), Take 1 tablet by mouth daily., Disp: , Rfl:  .  naproxen (NAPROSYN) 500 MG tablet, Take 500 mg by mouth 2 (two) times daily., Disp: , Rfl:  .  nortriptyline (PAMELOR) 25 MG capsule, Take 50 mg by mouth at bedtime.,  Disp: , Rfl:  .  NUCYNTA ER 100 MG 12 hr tablet, Take 100 mg by mouth every 12 (twelve) hours., Disp: , Rfl:  .  oxyCODONE-acetaminophen (PERCOCET) 5-325 MG tablet, Take 1-2 tablets by mouth every 4 (four) hours as needed for severe pain., Disp: 30 tablet, Rfl: 0 .  pregabalin (LYRICA) 150 MG capsule, Take 150 mg by mouth 2 (two) times daily., Disp: , Rfl:  .  topiramate (TOPAMAX) 25 MG tablet, Take 25 mg by mouth daily., Disp: , Rfl:  .  traZODone (DESYREL) 50 MG tablet, Take 50 mg by mouth at bedtime as needed for sleep., Disp: , Rfl:  .  zinc gluconate 50 MG tablet, Take 50 mg by mouth daily., Disp: , Rfl:   Social History   Tobacco Use  Smoking Status Not on file  Smokeless Tobacco Not on file    No Known Allergies Objective:  There were no vitals filed for this visit. There is no height or weight on file to calculate BMI. Constitutional Well developed. Well nourished.  Vascular Dorsalis pedis pulses palpable bilaterally. Posterior tibial pulses palpable bilaterally. Capillary refill normal to all digits.  No cyanosis or clubbing noted. Pedal hair growth normal.  Neurologic Normal speech. Oriented to person, place, and time. Epicritic sensation to light touch grossly present  bilaterally.  Dermatologic  hyperkeratotic lesion noted to the left hallux pain on palpation to the lesion.  No pinpoint bleeding noted upon debridement.  Hallux malleus contracture noted of the left hallux.  Orthopedic: Normal joint ROM without pain or crepitus bilaterally. No visible deformities. No bony tenderness.   Radiographs: None Assessment:   1. Hallux malleus of left foot    Plan:  Patient was evaluated and treated and all questions answered.  Left hallux malleus with underlying hyperkeratotic lesion -I explained to the patient the etiology of hallux [place] treatment options were discussed.  Given the contracture I believe patient may benefit from aggressive debridement of the lesion.   Using chisel blade and a handle the lesion was debrided down to healthy tissue no pinpoint bleeding noted no complication noted. -I discussed with the patient the etiology of hallux malleus and various treatment options were discussed for that as well.  And I believe ultimately she may benefit from tenotomy or correction of the hallux malleus if it continues to cause her pain.  Patient states understanding. -Toe protectors were dispensed  Return in about 3 months (around 06/29/2021).

## 2021-05-04 ENCOUNTER — Other Ambulatory Visit: Payer: Self-pay

## 2021-05-04 ENCOUNTER — Ambulatory Visit (INDEPENDENT_AMBULATORY_CARE_PROVIDER_SITE_OTHER): Payer: Medicare Other | Admitting: Podiatry

## 2021-05-04 DIAGNOSIS — T63301A Toxic effect of unspecified spider venom, accidental (unintentional), initial encounter: Secondary | ICD-10-CM | POA: Diagnosis not present

## 2021-05-04 MED ORDER — DOXYCYCLINE HYCLATE 100 MG PO TABS: 100.0000 mg | ORAL_TABLET | Freq: Two times a day (BID) | ORAL | 0 refills | Status: DC

## 2021-05-08 ENCOUNTER — Encounter: Payer: Self-pay | Admitting: Podiatry

## 2021-05-08 NOTE — Progress Notes (Signed)
Subjective:  Patient ID: Connie Brewer, female    DOB: January 26, 1961,  MRN: [id number]  Chief Complaint  Patient presents with  . Toe Pain    Toe pain. PT stated that she was bit by a spider    60 y.o. female presents with the above complaint.  Patient presents with a new complaint of possible spider bite to the left hallux.  Patient states that she was bit by a spider.  She states that it is little bit painful to the left hallux.  She has not done anything for it.  She has not taken any antibiotics for it.  She made an appointment to be seen right away.  She does not have any clinical signs of infection.  She denies any other acute complaints.   Review of Systems: Negative except as noted in the HPI. Denies N/V/F/Ch.  Past Medical History:  Diagnosis Date  . Diabetes mellitus without complication ([place])   . Lupus ([place])     Current Outpatient Medications:  .  Blood Glucose Monitoring Suppl (FIFTY50 GLUCOSE METER 2.0) w/Device KIT, See admin instructions., Disp: , Rfl:  .  doxycycline (VIBRA-TABS) 100 MG tablet, Take 1 tablet (100 mg total) by mouth 2 (two) times daily., Disp: 10 tablet, Rfl: 0 .  glucose blood ([employer] BLOOD GLUCOSE TEST) test strip, Check blood sugar as directed once a day and for symptoms of high or low blood sugar., Disp: , Rfl:  .  Methylcobalamin 1 MG CHEW, Take an over the counter vitamin B12 supplement daily, Disp: , Rfl:  .  ramelteon (ROZEREM) 8 MG tablet, Take by mouth., Disp: , Rfl:  .  Semaglutide, 1 MG/DOSE, (OZEMPIC, 1 MG/DOSE,) 4 MG/3ML SOPN, INJECT 1MG UNDER THE SKIN EVERY 7 DAYS, Disp: , Rfl:  .  ALPRAZolam (XANAX) 0.5 MG tablet, SMARTSIG:1-2 Tablet(s) By Mouth, Disp: , Rfl:  .  baclofen (LIORESAL) 10 MG tablet, Take 10 mg by mouth 3 (three) times daily., Disp: , Rfl:  .  Biotin 10 MG TABS, Take 10 mg by mouth daily., Disp: , Rfl:  .  buPROPion (WELLBUTRIN XL) 300 MG 24 hr tablet, Take 300 mg by mouth daily., Disp: , Rfl:  .  diclofenac Sodium  (VOLTAREN) 1 % GEL, Apply 2 g topically 4 (four) times daily., Disp: , Rfl:  .  HYDROcodone-acetaminophen (NORCO) 5-325 MG tablet, Take 1 tablet by mouth every 6 (six) hours as needed for moderate pain. (Patient not taking: Reported on 04/25/2020), Disp: 20 tablet, Rfl: 0 .  lidocaine (LIDODERM) 5 %, SMARTSIG:Topical, Disp: , Rfl:  .  metFORMIN (GLUCOPHAGE-XR) 500 MG 24 hr tablet, Take 1,000 mg by mouth 2 (two) times daily., Disp: , Rfl:  .  Multiple Vitamin (MULTIVITAMIN) tablet, Take 1 tablet by mouth daily., Disp: , Rfl:  .  Multiple Vitamins-Minerals (WOMENS BONE HEALTH PO), Take 1 tablet by mouth daily., Disp: , Rfl:  .  naproxen (NAPROSYN) 500 MG tablet, Take 500 mg by mouth 2 (two) times daily., Disp: , Rfl:  .  nortriptyline (PAMELOR) 25 MG capsule, Take 50 mg by mouth at bedtime., Disp: , Rfl:  .  NUCYNTA ER 100 MG 12 hr tablet, Take 100 mg by mouth every 12 (twelve) hours., Disp: , Rfl:  .  ONETOUCH VERIO test strip, SMARTSIG:Strip(s) Via Meter Daily, Disp: , Rfl:  .  oxyCODONE-acetaminophen (PERCOCET) 5-325 MG tablet, Take 1-2 tablets by mouth every 4 (four) hours as needed for severe pain., Disp: 30 tablet, Rfl: 0 .  OZEMPIC, 1  MG/DOSE, 4 MG/3ML SOPN, Inject 1 mg into the skin once a week., Disp: , Rfl:  .  pregabalin (LYRICA) 150 MG capsule, Take 150 mg by mouth 2 (two) times daily., Disp: , Rfl:  .  ramelteon (ROZEREM) 8 MG tablet, Take 8 mg by mouth at bedtime as needed., Disp: , Rfl:  .  topiramate (TOPAMAX) 25 MG tablet, Take 25 mg by mouth daily., Disp: , Rfl:  .  traZODone (DESYREL) 50 MG tablet, Take 50 mg by mouth at bedtime as needed for sleep., Disp: , Rfl:  .  zinc gluconate 50 MG tablet, Take 50 mg by mouth daily., Disp: , Rfl:   Social History   Tobacco Use  Smoking Status Not on file  Smokeless Tobacco Not on file    No Known Allergies Objective:  There were no vitals filed for this visit. There is no height or weight on file to calculate BMI. Constitutional  Well developed. Well nourished.  Vascular Dorsalis pedis pulses palpable bilaterally. Posterior tibial pulses palpable bilaterally. Capillary refill normal to all digits.  No cyanosis or clubbing noted. Pedal hair growth normal.  Neurologic Normal speech. Oriented to person, place, and time. Epicritic sensation to light touch grossly present bilaterally.  Dermatologic  no signs of spider bite noted.  No raised papule or clinical signs of infection noted.  No wound noted.  Orthopedic: Normal joint ROM without pain or crepitus bilaterally. No visible deformities. No bony tenderness.   Radiographs: None Assessment:   1. Accidental spider bite    Plan:  Patient was evaluated and treated and all questions answered.  Left hallux spider bite -I explained to the patient the etiology of spider bites and various treatment options were extensively discussed with the patient.  Clinically I am not able to appreciate any local signs of infection or any signs of bite.  However given that she is very tentative about being bitten by a spider I will place her on 10 days of doxycycline to help with any kind of skin and soft tissue infection.  She states understanding. -Doxycycline was dispensed and have asked her to complete the course.   No follow-ups on file.

## 2021-07-04 ENCOUNTER — Ambulatory Visit: Payer: Medicare Other | Admitting: Podiatry

## 2021-07-23 ENCOUNTER — Ambulatory Visit (INDEPENDENT_AMBULATORY_CARE_PROVIDER_SITE_OTHER): Payer: Medicare Other

## 2021-07-23 ENCOUNTER — Ambulatory Visit (INDEPENDENT_AMBULATORY_CARE_PROVIDER_SITE_OTHER): Payer: Medicare Other | Admitting: Podiatry

## 2021-07-23 ENCOUNTER — Other Ambulatory Visit: Payer: Self-pay

## 2021-07-23 ENCOUNTER — Ambulatory Visit: Payer: Medicare Other

## 2021-07-23 DIAGNOSIS — G8929 Other chronic pain: Secondary | ICD-10-CM | POA: Diagnosis not present

## 2021-07-23 DIAGNOSIS — Z981 Arthrodesis status: Secondary | ICD-10-CM | POA: Diagnosis not present

## 2021-07-23 DIAGNOSIS — M79672 Pain in left foot: Secondary | ICD-10-CM

## 2021-07-23 DIAGNOSIS — E0843 Diabetes mellitus due to underlying condition with diabetic autonomic (poly)neuropathy: Secondary | ICD-10-CM | POA: Diagnosis not present

## 2021-07-23 NOTE — Progress Notes (Signed)
   HPI: 60 y.o. female presenting today for evaluation of chronic pain and tenderness to the left foot and ankle.  Patient has a history of left ankle arthrodesis 2017.  She has had pain and tenderness ever since.  Currently she is being managed by pain management physician.  She presents for further treatment and evaluation  Past Medical History:  Diagnosis Date   Diabetes mellitus without complication (HCC)    Lupus (HCC)      Physical Exam: General: The patient is alert and oriented x3 in no acute distress.  Dermatology: Skin is warm, dry and supple bilateral lower extremities. Negative for open lesions or macerations.  Vascular: Palpable pedal pulses bilaterally. No edema or erythema noted. Capillary refill within normal limits.  Neurological: Epicritic and protective threshold grossly intact bilaterally.  Hyperparesthesia noted left foot  Musculoskeletal Exam: Negative range of motion to the ankle joint.  Chronic pain and tenderness even with light touch to left foot and ankle  Radiographic Exam:  Normal osseous mineralization.  Anterior ankle plate visualized on lateral view with good arthrodesis of the tibiotalar joint.  There does appear to be a broken orthopedic screw to the distal plate into the talus however this appears stable  Assessment: 1. H/o left ankle arthrodesis.  2017. Dr. Lorin Picket in [place] 2.  Chronic pain left lower extremity 3.  Diabetes mellitus with peripheral polyneuropathy   Plan of Care:  1. Patient evaluated. X-Rays reviewed.  2.  Patient has had chronic pain and tenderness to the left lower extremity served for several years now.  She is being closely managed and followed by pain management physician.  She receives back injections and she has also had e-stim with physical therapy that she is not able to tolerate. 3.  Unfortunately, I explained to the patient there is nothing further I could offer her to help alleviate her pain.  Continue management  with her pain management physician 4.  Return to clinic as needed      Felecia Shelling, DPM [facility]  Dr. Felecia Shelling, DPM    [address] [place], [place] [address]                Office [phone]  Fax [phone]

## 2021-07-23 NOTE — Progress Notes (Signed)
Right

## 2021-08-15 ENCOUNTER — Ambulatory Visit (INDEPENDENT_AMBULATORY_CARE_PROVIDER_SITE_OTHER): Payer: Medicare Other | Admitting: Podiatry

## 2021-08-15 ENCOUNTER — Ambulatory Visit (INDEPENDENT_AMBULATORY_CARE_PROVIDER_SITE_OTHER): Payer: Medicare Other

## 2021-08-15 ENCOUNTER — Other Ambulatory Visit: Payer: Self-pay

## 2021-08-15 DIAGNOSIS — M79672 Pain in left foot: Secondary | ICD-10-CM

## 2021-08-15 DIAGNOSIS — G8929 Other chronic pain: Secondary | ICD-10-CM

## 2021-08-15 DIAGNOSIS — Z981 Arthrodesis status: Secondary | ICD-10-CM | POA: Diagnosis not present

## 2021-08-15 NOTE — Progress Notes (Signed)
   HPI: 60 y.o. female presenting today for evaluation of chronic pain and tenderness to the left foot and ankle.  Patient has a history of left ankle arthrodesis 2017.  She has had pain and tenderness ever since.  Currently she is being managed by pain management physician.  She was last seen in the office on 07/23/2021 and it was explained to her that there was no additional treatment modality or therapy that I could offer to help alleviate her pain.   Past Medical History:  Diagnosis Date   Diabetes mellitus without complication (HCC)    Lupus (HCC)      Physical Exam: General: The patient is alert and oriented x3 in no acute distress.  Dermatology: Skin is warm, dry and supple bilateral lower extremities. Negative for open lesions or macerations.  Vascular: Palpable pedal pulses bilaterally. Today there is some edema noted to the foot and ankle left. Capillary refill within normal limits.  Neurological: Epicritic and protective threshold grossly intact bilaterally.  Hyperparesthesia noted left foot  Musculoskeletal Exam: Negative range of motion to the ankle joint.  Chronic pain and tenderness even with light touch to left foot and ankle  Radiographic Exam:  Normal osseous mineralization.  Anterior ankle plate visualized on lateral view with good arthrodesis of the tibiotalar joint.  There does appear to be a broken orthopedic screw to the distal plate into the talus however this appears stable. There is also likely calcifications noted to the anterior leg visualized on lateral view.   Assessment: 1. H/o left ankle arthrodesis.  2017. Dr. Lorin Picket in [place] 2.  Chronic pain left lower extremity 3.  Diabetes mellitus with peripheral polyneuropathy 4.  Managed with pain management   Plan of Care:  1. Patient evaluated. X-Rays reviewed.  2.  Patient has had chronic pain and tenderness to the left lower extremity served for several years now.  She is being closely managed and  followed by pain management physician.  She receives back injections and she has also had e-stim with physical therapy that she is not able to tolerate. The patient also states that she has tried different bracing for the foot with no relief.  3.  Unfortunately, I explained again today to the patient there is nothing further I could offer her to help alleviate her pain.  Continue management with her pain management physician 4.  Return to clinic as needed      Felecia Shelling, DPM [facility]  Dr. Felecia Shelling, DPM    [address] [place], [place] [address]                Office [phone]  Fax [phone]

## 2021-11-09 LAB — EXTERNAL GENERIC LAB PROCEDURE: COLOGUARD: NEGATIVE

## 2021-11-09 LAB — COLOGUARD: COLOGUARD: NEGATIVE

## 2022-06-12 ENCOUNTER — Ambulatory Visit (INDEPENDENT_AMBULATORY_CARE_PROVIDER_SITE_OTHER): Payer: Medicare Other | Admitting: Podiatry

## 2022-06-12 DIAGNOSIS — M7752 Other enthesopathy of left foot: Secondary | ICD-10-CM | POA: Diagnosis not present

## 2022-06-12 DIAGNOSIS — M7751 Other enthesopathy of right foot: Secondary | ICD-10-CM | POA: Diagnosis not present

## 2022-06-14 NOTE — Progress Notes (Signed)
Subjective:  Patient ID: Connie Brewer, female    DOB: March 19, 1961,  MRN: [id number]  No chief complaint on file.   61 y.o. female presents with the above complaint. Patient presents with complaint of bilateral hallux IPJ capsulitis.  Patient states left is greater than left side has been very painful.  She would like to discuss treatment options he has not seen anyone else prior to seeing me.  Hurts with ambulation hurts with pressure.  Pain scale 7 out of 10.  He has not done a steroid injection   Review of Systems: Negative except as noted in the HPI. Denies N/V/F/Ch.  Past Medical History:  Diagnosis Date   Diabetes mellitus without complication (HCC)    Lupus ([place])     Current Outpatient Medications:    ALPRAZolam (XANAX) 0.5 MG tablet, SMARTSIG:1-2 Tablet(s) By Mouth, Disp: , Rfl:    baclofen (LIORESAL) 10 MG tablet, Take 10 mg by mouth 3 (three) times daily., Disp: , Rfl:    Biotin 10 MG TABS, Take 10 mg by mouth daily., Disp: , Rfl:    Blood Glucose Monitoring Suppl (FIFTY50 GLUCOSE METER 2.0) w/Device KIT, See admin instructions., Disp: , Rfl:    buPROPion (WELLBUTRIN XL) 300 MG 24 hr tablet, Take 300 mg by mouth daily., Disp: , Rfl:    diclofenac Sodium (VOLTAREN) 1 % GEL, Apply 2 g topically 4 (four) times daily., Disp: , Rfl:    doxycycline (VIBRA-TABS) 100 MG tablet, Take 1 tablet (100 mg total) by mouth 2 (two) times daily., Disp: 10 tablet, Rfl: 0   glucose blood ([employer]ER BLOOD GLUCOSE TEST) test strip, Check blood sugar as directed once a day and for symptoms of high or low blood sugar., Disp: , Rfl:    HYDROcodone-acetaminophen (NORCO) 5-325 MG tablet, Take 1 tablet by mouth every 6 (six) hours as needed for moderate pain. (Patient not taking: Reported on 04/25/2020), Disp: 20 tablet, Rfl: 0   lidocaine (LIDODERM) 5 %, SMARTSIG:Topical, Disp: , Rfl:    metFORMIN (GLUCOPHAGE-XR) 500 MG 24 hr tablet, Take 1,000 mg by mouth 2 (two) times daily., Disp: , Rfl:     Methylcobalamin 1 MG CHEW, Take an over the counter vitamin B12 supplement daily, Disp: , Rfl:    Multiple Vitamin (MULTIVITAMIN) tablet, Take 1 tablet by mouth daily., Disp: , Rfl:    Multiple Vitamins-Minerals (WOMENS BONE HEALTH PO), Take 1 tablet by mouth daily., Disp: , Rfl:    naproxen (NAPROSYN) 500 MG tablet, Take 500 mg by mouth 2 (two) times daily., Disp: , Rfl:    nortriptyline (PAMELOR) 25 MG capsule, Take 50 mg by mouth at bedtime., Disp: , Rfl:    NUCYNTA ER 100 MG 12 hr tablet, Take 100 mg by mouth every 12 (twelve) hours., Disp: , Rfl:    ONETOUCH VERIO test strip, SMARTSIG:Strip(s) Via Meter Daily, Disp: , Rfl:    oxyCODONE-acetaminophen (PERCOCET) 5-325 MG tablet, Take 1-2 tablets by mouth every 4 (four) hours as needed for severe pain., Disp: 30 tablet, Rfl: 0   OZEMPIC, 1 MG/DOSE, 4 MG/3ML SOPN, Inject 1 mg into the skin once a week., Disp: , Rfl:    pregabalin (LYRICA) 150 MG capsule, Take 150 mg by mouth 2 (two) times daily., Disp: , Rfl:    ramelteon (ROZEREM) 8 MG tablet, Take 8 mg by mouth at bedtime as needed., Disp: , Rfl:    ramelteon (ROZEREM) 8 MG tablet, Take by mouth., Disp: , Rfl:    Semaglutide, 1 MG/DOSE, (OZEMPIC,  1 MG/DOSE,) 4 MG/3ML SOPN, INJECT 1MG UNDER THE SKIN EVERY 7 DAYS, Disp: , Rfl:    topiramate (TOPAMAX) 25 MG tablet, Take 25 mg by mouth daily., Disp: , Rfl:    traZODone (DESYREL) 50 MG tablet, Take 50 mg by mouth at bedtime as needed for sleep., Disp: , Rfl:    zinc gluconate 50 MG tablet, Take 50 mg by mouth daily., Disp: , Rfl:   Social History   Tobacco Use  Smoking Status Not on file  Smokeless Tobacco Not on file    No Known Allergies Objective:  There were no vitals filed for this visit. There is no height or weight on file to calculate BMI. Constitutional Well developed. Well nourished.  Vascular Dorsalis pedis pulses palpable bilaterally. Posterior tibial pulses palpable bilaterally. Capillary refill normal to all digits.  No  cyanosis or clubbing noted. Pedal hair growth normal.  Neurologic Normal speech. Oriented to person, place, and time. Epicritic sensation to light touch grossly present bilaterally.  Dermatologic Nails well groomed and normal in appearance. No open wounds. No skin lesions.  Orthopedic: Pain on palpation to the IPJ joint of bilateral hallux.  Mild pain with range of motion.  No deep intra-articular pain noted.  No pain at the metatarsophalangeal joint bilaterally.   Radiographs: None Assessment:   1. Capsulitis of toe, left   2. Capsulitis of toe, right    Plan:  Patient was evaluated and treated and all questions answered.  -Bilateral hallux IPJ capsulitis -All questions and concerns were discussed with the patient in extensive detail -Given the amount of pain that he is experiencing he will benefit from a steroid injection to help decrease acute inflammatory component associate with pain.  Patient agrees with the plan like to proceed with steroid injection. -A steroid injection was performed at bilateral interphalangeal joint hallux using 1% plain Lidocaine and 10 mg of Kenalog. This was well tolerated.  No follow-ups on file.

## 2022-10-02 ENCOUNTER — Ambulatory Visit (INDEPENDENT_AMBULATORY_CARE_PROVIDER_SITE_OTHER): Payer: Medicare Other | Admitting: Podiatry

## 2022-10-02 DIAGNOSIS — M722 Plantar fascial fibromatosis: Secondary | ICD-10-CM | POA: Diagnosis not present

## 2022-10-02 NOTE — Progress Notes (Signed)
Subjective:  Patient ID: Connie Brewer, female    DOB: 02/24/1961,  MRN: [id number]  Chief Complaint  Patient presents with   Toe Pain    61 y.o. female presents with the above complaint.  Patient presents with new complaint of right [place] fasciitis patient's has been hurting has hurts with ambulation hurts with pressure she would like to do an injection.  She has not seen MRIs prior to seeing me.  She states the injection helps.  Pain scale 7 out of 10.  She denies any other acute complaints.   Review of Systems: Negative except as noted in the HPI. Denies N/V/F/Ch.  Past Medical History:  Diagnosis Date   Diabetes mellitus without complication (HCC)    Lupus ([place])     Current Outpatient Medications:    ALPRAZolam (XANAX) 0.5 MG tablet, SMARTSIG:1-2 Tablet(s) By Mouth, Disp: , Rfl:    baclofen (LIORESAL) 10 MG tablet, Take 10 mg by mouth 3 (three) times daily., Disp: , Rfl:    Biotin 10 MG TABS, Take 10 mg by mouth daily., Disp: , Rfl:    Blood Glucose Monitoring Suppl (FIFTY50 GLUCOSE METER 2.0) w/Device KIT, See admin instructions., Disp: , Rfl:    buPROPion (WELLBUTRIN XL) 300 MG 24 hr tablet, Take 300 mg by mouth daily., Disp: , Rfl:    diclofenac Sodium (VOLTAREN) 1 % GEL, Apply 2 g topically 4 (four) times daily., Disp: , Rfl:    doxycycline (VIBRA-TABS) 100 MG tablet, Take 1 tablet (100 mg total) by mouth 2 (two) times daily., Disp: 10 tablet, Rfl: 0   glucose blood ([employer]ER BLOOD GLUCOSE TEST) test strip, Check blood sugar as directed once a day and for symptoms of high or low blood sugar., Disp: , Rfl:    HYDROcodone-acetaminophen (NORCO) 5-325 MG tablet, Take 1 tablet by mouth every 6 (six) hours as needed for moderate pain. (Patient not taking: Reported on 04/25/2020), Disp: 20 tablet, Rfl: 0   lidocaine (LIDODERM) 5 %, SMARTSIG:Topical, Disp: , Rfl:    metFORMIN (GLUCOPHAGE-XR) 500 MG 24 hr tablet, Take 1,000 mg by mouth 2 (two) times daily., Disp: , Rfl:     Methylcobalamin 1 MG CHEW, Take an over the counter vitamin B12 supplement daily, Disp: , Rfl:    Multiple Vitamin (MULTIVITAMIN) tablet, Take 1 tablet by mouth daily., Disp: , Rfl:    Multiple Vitamins-Minerals (WOMENS BONE HEALTH PO), Take 1 tablet by mouth daily., Disp: , Rfl:    naproxen (NAPROSYN) 500 MG tablet, Take 500 mg by mouth 2 (two) times daily., Disp: , Rfl:    nortriptyline (PAMELOR) 25 MG capsule, Take 50 mg by mouth at bedtime., Disp: , Rfl:    NUCYNTA ER 100 MG 12 hr tablet, Take 100 mg by mouth every 12 (twelve) hours., Disp: , Rfl:    ONETOUCH VERIO test strip, SMARTSIG:Strip(s) Via Meter Daily, Disp: , Rfl:    oxyCODONE-acetaminophen (PERCOCET) 5-325 MG tablet, Take 1-2 tablets by mouth every 4 (four) hours as needed for severe pain., Disp: 30 tablet, Rfl: 0   OZEMPIC, 1 MG/DOSE, 4 MG/3ML SOPN, Inject 1 mg into the skin once a week., Disp: , Rfl:    pregabalin (LYRICA) 150 MG capsule, Take 150 mg by mouth 2 (two) times daily., Disp: , Rfl:    ramelteon (ROZEREM) 8 MG tablet, Take 8 mg by mouth at bedtime as needed., Disp: , Rfl:    ramelteon (ROZEREM) 8 MG tablet, Take by mouth., Disp: , Rfl:    Semaglutide, 1  MG/DOSE, (OZEMPIC, 1 MG/DOSE,) 4 MG/3ML SOPN, INJECT 1MG UNDER THE SKIN EVERY 7 DAYS, Disp: , Rfl:    topiramate (TOPAMAX) 25 MG tablet, Take 25 mg by mouth daily., Disp: , Rfl:    traZODone (DESYREL) 50 MG tablet, Take 50 mg by mouth at bedtime as needed for sleep., Disp: , Rfl:    zinc gluconate 50 MG tablet, Take 50 mg by mouth daily., Disp: , Rfl:   Social History   Tobacco Use  Smoking Status Not on file  Smokeless Tobacco Not on file    No Known Allergies Objective:  There were no vitals filed for this visit. There is no height or weight on file to calculate BMI. Constitutional Well developed. Well nourished.  Vascular Dorsalis pedis pulses palpable bilaterally. Posterior tibial pulses palpable bilaterally. Capillary refill normal to all digits.  No  cyanosis or clubbing noted. Pedal hair growth normal.  Neurologic Normal speech. Oriented to person, place, and time. Epicritic sensation to light touch grossly present bilaterally.  Dermatologic Nails well groomed and normal in appearance. No open wounds. No skin lesions.  Orthopedic: Normal joint ROM without pain or crepitus bilaterally. No visible deformities. Tender to palpation at the calcaneal tuber right. No pain with calcaneal squeeze right. Ankle ROM diminished range of motion right. Sil[place] Test: positive right.   Radiographs: None  Assessment:   1. Plantar fasciitis of right foot    Plan:  Patient was evaluated and treated and all questions answered.  Plantar Fasciitis, right - XR reviewed as above.  - Educated on icing and stretching. Instructions given.  - Injection delivered to the plantar fascia as below. - DME: None - Pharmacologic management: None  Procedure: Injection Tendon/Ligament Location: Right plantar fascia at the glabrous junction; medial approach. Skin Prep: alcohol Injectate: 0.5 cc 0.5% marcaine plain, 0.5 cc of 1% Lidocaine, 0.5 cc kenalog 10. Disposition: Patient tolerated procedure well. Injection site dressed with a band-aid.  No follow-ups on file.

## 2022-10-08 ENCOUNTER — Encounter: Payer: Self-pay | Admitting: Podiatry

## 2022-12-20 ENCOUNTER — Ambulatory Visit (INDEPENDENT_AMBULATORY_CARE_PROVIDER_SITE_OTHER): Payer: Medicare Other | Admitting: Podiatry

## 2022-12-20 DIAGNOSIS — I999 Unspecified disorder of circulatory system: Secondary | ICD-10-CM | POA: Diagnosis not present

## 2022-12-20 DIAGNOSIS — M7752 Other enthesopathy of left foot: Secondary | ICD-10-CM

## 2022-12-20 NOTE — Progress Notes (Signed)
Subjective:  Patient ID: Connie Brewer, female    DOB: [birth year],  MRN: [id number]  Chief Complaint  Patient presents with   Nail Problem    61 y.o. female presents with the above complaint. Patient presents with complaint of left hallux IPJ capsulitis.  Patient states the pain started coming back hurts with ambulation hurts with pressure denies any other acute complaints.   Review of Systems: Negative except as noted in the HPI. Denies N/V/F/Ch.  Past Medical History:  Diagnosis Date   Diabetes mellitus without complication (HCC)    Lupus ([place])     Current Outpatient Medications:    ALPRAZolam (XANAX) 0.5 MG tablet, SMARTSIG:1-2 Tablet(s) By Mouth, Disp: , Rfl:    baclofen (LIORESAL) 10 MG tablet, Take 10 mg by mouth 3 (three) times daily., Disp: , Rfl:    Biotin 10 MG TABS, Take 10 mg by mouth daily., Disp: , Rfl:    Blood Glucose Monitoring Suppl (FIFTY50 GLUCOSE METER 2.0) w/Device KIT, See admin instructions., Disp: , Rfl:    buPROPion (WELLBUTRIN XL) 300 MG 24 hr tablet, Take 300 mg by mouth daily., Disp: , Rfl:    diclofenac Sodium (VOLTAREN) 1 % GEL, Apply 2 g topically 4 (four) times daily., Disp: , Rfl:    doxycycline (VIBRA-TABS) 100 MG tablet, Take 1 tablet (100 mg total) by mouth 2 (two) times daily., Disp: 10 tablet, Rfl: 0   glucose blood ([employer] BLOOD GLUCOSE TEST) test strip, Check blood sugar as directed once a day and for symptoms of high or low blood sugar., Disp: , Rfl:    HYDROcodone-acetaminophen (NORCO) 5-325 MG tablet, Take 1 tablet by mouth every 6 (six) hours as needed for moderate pain. (Patient not taking: Reported on 04/25/2020), Disp: 20 tablet, Rfl: 0   lidocaine (LIDODERM) 5 %, SMARTSIG:Topical, Disp: , Rfl:    metFORMIN (GLUCOPHAGE-XR) 500 MG 24 hr tablet, Take 1,000 mg by mouth 2 (two) times daily., Disp: , Rfl:    Methylcobalamin 1 MG CHEW, Take an over the counter vitamin B12 supplement daily, Disp: , Rfl:    Multiple Vitamin (MULTIVITAMIN)  tablet, Take 1 tablet by mouth daily., Disp: , Rfl:    Multiple Vitamins-Minerals (WOMENS BONE HEALTH PO), Take 1 tablet by mouth daily., Disp: , Rfl:    naproxen (NAPROSYN) 500 MG tablet, Take 500 mg by mouth 2 (two) times daily., Disp: , Rfl:    nortriptyline (PAMELOR) 25 MG capsule, Take 50 mg by mouth at bedtime., Disp: , Rfl:    NUCYNTA ER 100 MG 12 hr tablet, Take 100 mg by mouth every 12 (twelve) hours., Disp: , Rfl:    ONETOUCH VERIO test strip, SMARTSIG:Strip(s) Via Meter Daily, Disp: , Rfl:    oxyCODONE-acetaminophen (PERCOCET) 5-325 MG tablet, Take 1-2 tablets by mouth every 4 (four) hours as needed for severe pain., Disp: 30 tablet, Rfl: 0   OZEMPIC, 1 MG/DOSE, 4 MG/3ML SOPN, Inject 1 mg into the skin once a week., Disp: , Rfl:    pregabalin (LYRICA) 150 MG capsule, Take 150 mg by mouth 2 (two) times daily., Disp: , Rfl:    ramelteon (ROZEREM) 8 MG tablet, Take 8 mg by mouth at bedtime as needed., Disp: , Rfl:    ramelteon (ROZEREM) 8 MG tablet, Take by mouth., Disp: , Rfl:    Semaglutide, 1 MG/DOSE, (OZEMPIC, 1 MG/DOSE,) 4 MG/3ML SOPN, INJECT 1MG UNDER THE SKIN EVERY 7 DAYS, Disp: , Rfl:    topiramate (TOPAMAX) 25 MG tablet, Take 25 mg by  mouth daily., Disp: , Rfl:    traZODone (DESYREL) 50 MG tablet, Take 50 mg by mouth at bedtime as needed for sleep., Disp: , Rfl:    zinc gluconate 50 MG tablet, Take 50 mg by mouth daily., Disp: , Rfl:   Social History   Tobacco Use  Smoking Status Not on file  Smokeless Tobacco Not on file    No Known Allergies Objective:  There were no vitals filed for this visit. There is no height or weight on file to calculate BMI. Constitutional Well developed. Well nourished.  Vascular Dorsalis pedis pulses palpable bilaterally. Posterior tibial pulses palpable bilaterally. Capillary refill normal to all digits.  No cyanosis or clubbing noted. Pedal hair growth normal.  Neurologic Normal speech. Oriented to person, place, and time. Epicritic  sensation to light touch grossly present bilaterally.  Dermatologic Nails well groomed and normal in appearance. No open wounds. No skin lesions.  Orthopedic: Pain on palpation to the IPJ joint of left hallux.  Mild pain with range of motion.  No deep intra-articular pain noted.  No pain at the metatarsophalangeal joint bilaterally.   Radiographs: None Assessment:   1. Vascular abnormality    Plan:  Patient was evaluated and treated and all questions answered.  Vascular abnormality -ABIs PVRs were ordered to assess the vascular flow.  Left hallux IPJ capsulitis -All questions and concerns were discussed with the patient in extensive detail -I will hold off on injection for now as I would like to assess her vascular flow to the left lower extremity.  If there is no concern we will plan with an injection afterwards.  No follow-ups on file.

## 2022-12-25 ENCOUNTER — Ambulatory Visit (HOSPITAL_COMMUNITY)
Admission: RE | Admit: 2022-12-25 | Discharge: 2022-12-25 | Disposition: A | Discharge status: Home / Self Care | Payer: Medicare Other | Source: Ambulatory Visit | Attending: Podiatry | Admitting: Podiatry

## 2022-12-25 ENCOUNTER — Telehealth: Payer: Self-pay

## 2022-12-25 DIAGNOSIS — I999 Unspecified disorder of circulatory system: Secondary | ICD-10-CM

## 2022-12-25 NOTE — Progress Notes (Signed)
ABI attempted. During exam, patient had two witnessed seizures with left upper extremity tremor, occurring after pressure was applied to her left arm and left leg. ABI was aborted, and only lower extremity waveforms were obtained.   Patient encounter discussed with Cory Roughen, PA-C, and RN of Dr. Eliane Decree office.  12/25/2022 11:46 AM Eula Fried., MHA, RVT, RDCS, RDMS

## 2023-01-06 NOTE — Telephone Encounter (Signed)
done

## 2023-01-14 ENCOUNTER — Ambulatory Visit (INDEPENDENT_AMBULATORY_CARE_PROVIDER_SITE_OTHER): Payer: 59 | Admitting: Podiatry

## 2023-01-14 VITALS — BP 132/74

## 2023-01-14 DIAGNOSIS — I999 Unspecified disorder of circulatory system: Secondary | ICD-10-CM | POA: Diagnosis not present

## 2023-01-14 MED ORDER — ALPRAZOLAM 0.5 MG PO TABS: 0.5000 mg | ORAL_TABLET | Freq: Two times a day (BID) | ORAL | 0 refills | Status: AC | PRN

## 2023-01-14 NOTE — Progress Notes (Signed)
Subjective:  Patient ID: Connie Brewer, female    DOB: 27-Jun-1961,  MRN: [id number]  Chief Complaint  Patient presents with   Vascular abnormality    Pt stated that she is still having issues with her foot     62 y.o. female presents with the above complaint. Patient presents with complaint of left hallux IPJ capsulitis.  Patient states the pain started coming back hurts with ambulation hurts with pressure denies any other acute complaints.   Review of Systems: Negative except as noted in the HPI. Denies N/V/F/Ch.  Past Medical History:  Diagnosis Date   Diabetes mellitus without complication (HCC)    Lupus ([place])     Current Outpatient Medications:    ALPRAZolam (XANAX) 0.5 MG tablet, Take 1 tablet (0.5 mg total) by mouth 2 (two) times daily as needed for anxiety., Disp: 20 tablet, Rfl: 0   ALPRAZolam (XANAX) 0.5 MG tablet, SMARTSIG:1-2 Tablet(s) By Mouth, Disp: , Rfl:    baclofen (LIORESAL) 10 MG tablet, Take 10 mg by mouth 3 (three) times daily., Disp: , Rfl:    Biotin 10 MG TABS, Take 10 mg by mouth daily., Disp: , Rfl:    Blood Glucose Monitoring Suppl (FIFTY50 GLUCOSE METER 2.0) w/Device KIT, See admin instructions., Disp: , Rfl:    buPROPion (WELLBUTRIN XL) 300 MG 24 hr tablet, Take 300 mg by mouth daily., Disp: , Rfl:    diclofenac Sodium (VOLTAREN) 1 % GEL, Apply 2 g topically 4 (four) times daily., Disp: , Rfl:    doxycycline (VIBRA-TABS) 100 MG tablet, Take 1 tablet (100 mg total) by mouth 2 (two) times daily., Disp: 10 tablet, Rfl: 0   doxycycline (VIBRAMYCIN) 100 MG capsule, Take 1 capsule (100 mg total) by mouth 2 (two) times daily., Disp: 20 capsule, Rfl: 0   glucose blood ([employer]ER BLOOD GLUCOSE TEST) test strip, Check blood sugar as directed once a day and for symptoms of high or low blood sugar., Disp: , Rfl:    HYDROcodone-acetaminophen (NORCO) 5-325 MG tablet, Take 1 tablet by mouth every 6 (six) hours as needed for moderate pain. (Patient not taking: Reported on  04/25/2020), Disp: 20 tablet, Rfl: 0   lidocaine (LIDODERM) 5 %, SMARTSIG:Topical, Disp: , Rfl:    metFORMIN (GLUCOPHAGE-XR) 500 MG 24 hr tablet, Take 1,000 mg by mouth 2 (two) times daily., Disp: , Rfl:    Methylcobalamin 1 MG CHEW, Take an over the counter vitamin B12 supplement daily, Disp: , Rfl:    Multiple Vitamin (MULTIVITAMIN) tablet, Take 1 tablet by mouth daily., Disp: , Rfl:    Multiple Vitamins-Minerals (WOMENS BONE HEALTH PO), Take 1 tablet by mouth daily., Disp: , Rfl:    naproxen (NAPROSYN) 500 MG tablet, Take 500 mg by mouth 2 (two) times daily., Disp: , Rfl:    nortriptyline (PAMELOR) 25 MG capsule, Take 50 mg by mouth at bedtime., Disp: , Rfl:    NUCYNTA ER 100 MG 12 hr tablet, Take 100 mg by mouth every 12 (twelve) hours., Disp: , Rfl:    ONETOUCH VERIO test strip, SMARTSIG:Strip(s) Via Meter Daily, Disp: , Rfl:    oxyCODONE-acetaminophen (PERCOCET) 5-325 MG tablet, Take 1-2 tablets by mouth every 4 (four) hours as needed for severe pain., Disp: 30 tablet, Rfl: 0   OZEMPIC, 1 MG/DOSE, 4 MG/3ML SOPN, Inject 1 mg into the skin once a week., Disp: , Rfl:    pregabalin (LYRICA) 150 MG capsule, Take 150 mg by mouth 2 (two) times daily., Disp: , Rfl:  ramelteon (ROZEREM) 8 MG tablet, Take 8 mg by mouth at bedtime as needed., Disp: , Rfl:    ramelteon (ROZEREM) 8 MG tablet, Take by mouth., Disp: , Rfl:    Semaglutide, 1 MG/DOSE, (OZEMPIC, 1 MG/DOSE,) 4 MG/3ML SOPN, INJECT 1MG  UNDER THE SKIN EVERY 7 DAYS, Disp: , Rfl:    topiramate (TOPAMAX) 25 MG tablet, Take 25 mg by mouth daily., Disp: , Rfl:    traZODone (DESYREL) 50 MG tablet, Take 50 mg by mouth at bedtime as needed for sleep., Disp: , Rfl:    zinc gluconate 50 MG tablet, Take 50 mg by mouth daily., Disp: , Rfl:   Social History   Tobacco Use  Smoking Status Not on file  Smokeless Tobacco Not on file    No Known Allergies Objective:   Vitals:   01/14/23 1430  BP: 132/74   There is no height or weight on file to  calculate BMI. Constitutional Well developed. Well nourished.  Vascular Dorsalis pedis pulses palpable bilaterally. Posterior tibial pulses palpable bilaterally. Capillary refill normal to all digits.  No cyanosis or clubbing noted. Pedal hair growth normal.  Neurologic Normal speech. Oriented to person, place, and time. Epicritic sensation to light touch grossly present bilaterally.  Dermatologic Nails well groomed and normal in appearance. No open wounds. No skin lesions.  Orthopedic: Pain on palpation to the IPJ joint of left hallux.  Mild pain with range of motion.  No deep intra-articular pain noted.  No pain at the metatarsophalangeal joint bilaterally.   Radiographs: None Assessment:   1. Vascular abnormality    Plan:  Patient was evaluated and treated and all questions answered.  Vascular abnormality -Have reordered the ABIs.  Patient states that she needed antianxiety medication.  Xanax was prescribed to have her undergo the ABIs.  Left hallux IPJ capsulitis -All questions and concerns were discussed with the patient in extensive detail -I will hold off on injection for now as I would like to assess her vascular flow to the left lower extremity.  If there is no concern we will plan with an injection afterwards.  No follow-ups on file.

## 2023-01-15 ENCOUNTER — Telehealth: Payer: Self-pay | Admitting: Podiatry

## 2023-01-15 ENCOUNTER — Telehealth: Payer: Self-pay | Admitting: *Deleted

## 2023-01-15 NOTE — Telephone Encounter (Signed)
Connie Brewer with [facility]  My administrative assistant has asked me to have someone from your office contact the patient to let her know the test is not needed. Thank you!   Ammie will give pt a call.

## 2023-01-15 NOTE — Telephone Encounter (Signed)
Connie Brewer with [facility] has concerns with moving forward with the ABI due to pt having seizers while the cuff is inflating. Pt had 2 seizers in which she witnessed with the cuff on her arm and then again while it was on her leg. She wants to know how to proceed and if you are in contact with pts pcp. The re order shows patient was given anxiety medication to help with seizers.   Please advise how you want to move forward.  Dr Posey Pronto states he prescribed the pt anxiety medication to take 30 min prior to procedure to help with her seizers.  Just want to clarify, this was not an episode of anxiety, she was totally calm, and stated that whenever a cuff is inflated or there is "tightness" on her left side, it causes "muscle spasms"- however these are not muscle spasms, it was most definitely two separate seizures when the left arm, then left leg was inflated. Does Xanax treat/prevent seizures? 5 mins KP Felipa Furnace, DPM nope it doesn't. She told me that that medication helps when she goes for test  4 mins MS Michelle A Simonetti Also- I did obtain pedal waveforms, and they were multiphasic. 4 mins Felipa Furnace, DPM oh okay sorry I didn't catch those if they were fine KP we can just cancel it  4 mins [facility] thank you, I just didn't feel it was ethical to induce another seizure(s) when there was nothing critical found by Doppler. Thanks! 4 mins KP Felipa Furnace, DPM oh yeah definitely I agree

## 2023-01-15 NOTE — Telephone Encounter (Signed)
error 

## 2023-01-17 ENCOUNTER — Emergency Department (HOSPITAL_COMMUNITY): Payer: 59

## 2023-01-17 ENCOUNTER — Emergency Department (HOSPITAL_COMMUNITY)
Admission: EM | Admit: 2023-01-17 | Discharge: 2023-01-17 | Disposition: A | Discharge status: Home / Self Care | Payer: 59 | Attending: Student | Admitting: Student

## 2023-01-17 DIAGNOSIS — Z7984 Long term (current) use of oral hypoglycemic drugs: Secondary | ICD-10-CM | POA: Diagnosis not present

## 2023-01-17 DIAGNOSIS — L089 Local infection of the skin and subcutaneous tissue, unspecified: Secondary | ICD-10-CM | POA: Insufficient documentation

## 2023-01-17 DIAGNOSIS — E119 Type 2 diabetes mellitus without complications: Secondary | ICD-10-CM | POA: Diagnosis not present

## 2023-01-17 DIAGNOSIS — M79674 Pain in right toe(s): Secondary | ICD-10-CM | POA: Insufficient documentation

## 2023-01-17 MED ORDER — DOXYCYCLINE HYCLATE 100 MG PO CAPS: 100.0000 mg | ORAL_CAPSULE | Freq: Two times a day (BID) | ORAL | 0 refills | Status: AC

## 2023-01-17 NOTE — Discharge Instructions (Addendum)
It was a pleasure taking care of you today.  As discussed, I suspect you have an infection in your toe.  I am sending you home with an antibiotic.  Take as prescribed and finish all antibiotics.  Please follow-up with your podiatrist early next week for further evaluation.  Return to the ER for new or worsening symptoms.

## 2023-01-17 NOTE — ED Provider Notes (Signed)
[place] EMERGENCY DEPARTMENT AT [facility] Provider Note   CSN: [id number] Arrival date & time: 01/17/23  1127     History  Chief Complaint  Patient presents with   Toe Pain    Connie Brewer is a 62 y.o. female with a past medical history significant for diabetes and lupus who presents to the ED due to right great toe pain that started last night.  Currently seeing podiatry for left hallux IPJ capsulitis and had a steroid injection recently.  Patient states she was advised by podiatry to report to the ED for further evaluation of her right great toe pain.  No history of gout.  No injury.  Denies fever and chills. No drainage.   History obtained from patient and past medical records. No interpreter used during encounter.       Home Medications Prior to Admission medications   Medication Sig Start Date End Date Taking? Authorizing Provider  doxycycline (VIBRAMYCIN) 100 MG capsule Take 1 capsule (100 mg total) by mouth 2 (two) times daily. 01/17/23  Yes Jarad Barth, Druscilla Brownie, PA-C  ALPRAZolam Duanne Moron) 0.5 MG tablet SMARTSIG:1-2 Tablet(s) By Mouth 04/17/21   [provider]  ALPRAZolam Duanne Moron) 0.5 MG tablet Take 1 tablet (0.5 mg total) by mouth 2 (two) times daily as needed for anxiety. 01/14/23   Felipa Furnace, DPM  baclofen (LIORESAL) 10 MG tablet Take 10 mg by mouth 3 (three) times daily. 04/11/20   [provider]  Biotin 10 MG TABS Take 10 mg by mouth daily.    [provider]  Blood Glucose Monitoring Suppl (FIFTY50 GLUCOSE METER 2.0) w/Device KIT See admin instructions. 04/28/20   [provider]  buPROPion (WELLBUTRIN XL) 300 MG 24 hr tablet Take 300 mg by mouth daily. 02/26/20   [provider]  diclofenac Sodium (VOLTAREN) 1 % GEL Apply 2 g topically 4 (four) times daily. 02/18/20   [provider]  doxycycline (VIBRA-TABS) 100 MG tablet Take 1 tablet (100 mg total) by mouth 2 (two) times daily. 05/04/21   Felipa Furnace, DPM  glucose blood ([employer] BLOOD GLUCOSE TEST) test strip Check blood sugar as directed once a day and for symptoms of high or low blood sugar. 01/24/21   [provider]  HYDROcodone-acetaminophen (NORCO) 5-325 MG tablet Take 1 tablet by mouth every 6 (six) hours as needed for moderate pain. Patient not taking: Reported on 04/25/2020 08/24/16   Duanne Guess, PA-C  lidocaine (LIDODERM) 5 % SMARTSIG:Topical 11/24/20   [provider]  metFORMIN (GLUCOPHAGE-XR) 500 MG 24 hr tablet Take 1,000 mg by mouth 2 (two) times daily. 11/30/19   [provider]  Methylcobalamin 1 MG CHEW Take an over the counter vitamin B12 supplement daily 06/01/20   [provider]  Multiple Vitamin (MULTIVITAMIN) tablet Take 1 tablet by mouth daily.    [provider]  Multiple Vitamins-Minerals (WOMENS BONE HEALTH PO) Take 1 tablet by mouth daily.    [provider]  naproxen (NAPROSYN) 500 MG tablet Take 500 mg by mouth 2 (two) times daily. 03/30/20   [provider]  nortriptyline (PAMELOR) 25 MG capsule Take 50 mg by mouth at bedtime. 04/04/20   [provider]  NUCYNTA ER 100 MG 12 hr tablet Take 100 mg by mouth every 12 (twelve) hours. 03/30/20   [provider]  Roma Schanz test strip SMARTSIG:Strip(s) Via Meter Daily 01/24/21   [provider]  oxyCODONE-acetaminophen (PERCOCET) 5-325 MG tablet Take  1-2 tablets by mouth every 4 (four) hours as needed for severe pain. 03/09/21   Candelaria Stagers, DPM  OZEMPIC, 1 MG/DOSE, 4 MG/3ML SOPN Inject 1 mg into the skin once a week. 04/17/21   [provider]  pregabalin (LYRICA) 150 MG capsule Take 150 mg by mouth 2 (two) times daily. 03/20/20   [provider]  ramelteon (ROZEREM) 8 MG tablet Take 8 mg by mouth at bedtime as needed. 03/17/21   [provider]  ramelteon (ROZEREM) 8 MG tablet Take by mouth. 12/18/20 12/18/21  [provider]  Semaglutide,  1 MG/DOSE, (OZEMPIC, 1 MG/DOSE,) 4 MG/3ML SOPN INJECT 1MG  UNDER THE SKIN EVERY 7 DAYS 04/17/21   [provider]  topiramate (TOPAMAX) 25 MG tablet Take 25 mg by mouth daily.    [provider]  traZODone (DESYREL) 50 MG tablet Take 50 mg by mouth at bedtime as needed for sleep. 03/30/20   [provider]  zinc gluconate 50 MG tablet Take 50 mg by mouth daily.    [provider]      Allergies    Patient has no known allergies.    Review of Systems   Review of Systems  Constitutional:  Negative for chills and fever.  Musculoskeletal:  Positive for arthralgias and gait problem.  Skin:  Positive for color change.  All other systems reviewed and are negative.   Physical Exam Updated Vital Signs BP (!) 140/66 (BP Location: Right Arm)   Pulse 98   Temp 98.8 F (37.1 C) (Oral)   Resp 14   SpO2 100%  Physical Exam Vitals and nursing note reviewed.  Constitutional:      General: She is not in acute distress.    Appearance: She is not ill-appearing.  HENT:     Head: Normocephalic.  Eyes:     Pupils: Pupils are equal, round, and reactive to light.  Cardiovascular:     Rate and Rhythm: Normal rate and regular rhythm.     Pulses: Normal pulses.     Heart sounds: Normal heart sounds. No murmur heard.    No friction rub. No gallop.  Pulmonary:     Effort: Pulmonary effort is normal.     Breath sounds: Normal breath sounds.  Abdominal:     General: Abdomen is flat. There is no distension.     Palpations: Abdomen is soft.     Tenderness: There is no abdominal tenderness. There is no guarding or rebound.  Musculoskeletal:        General: Normal range of motion.     Cervical back: Neck supple.     Comments: Tenderness palpation throughout right great toe.  Full range of motion of right great toe.  Pedal pulses palpable.  Skin:    General: Skin is warm and dry.     Comments: Abnormal growth of right great toe nail. No purulent drainage or fluctuance  surrounding nailbed.  Erythema throughout right great toe.  Neurological:     General: No focal deficit present.     Mental Status: She is alert.  Psychiatric:        Mood and Affect: Mood normal.        Behavior: Behavior normal.     ED Results / Procedures / Treatments   Labs (all labs ordered are listed, but only abnormal results are displayed) Labs Reviewed - No data to display  EKG None  Radiology DG Toe Great Right  Result Date: 01/17/2023 CLINICAL DATA:  Right great toe pain starting last night. EXAM: RIGHT GREAT TOE COMPARISON:  None Available. FINDINGS: There is no evidence of fracture or dislocation. No cortical erosion or periosteal reaction suggest osteomyelitis. Soft tissue swelling about the medial aspect of the distal phalanx. Mild degenerative changes of the interphalangeal joints. IMPRESSION: 1. No acute osseous abnormality, if there is clinical concern for osteomyelitis, MRI examination could be considered for further evaluation. 2. Soft tissue swelling about the medial aspect of the distal phalanx. Electronically Signed   By: Keane Police D.O.   On: 01/17/2023 12:51    Procedures Procedures    Medications Ordered in ED Medications - No data to display  ED Course/ Medical Decision Making/ A&P                             Medical Decision Making Amount and/or Complexity of Data Reviewed External Data Reviewed: notes.    Details: Podiatry note Radiology: ordered and independent interpretation performed. Decision-making details documented in ED Course.  Risk Prescription drug management.    62 year old female presents to the ED due to right great toe pain that started yesterday.  History of diabetes.  Patient following podiatry for left great toe and had a recent steroid injection.  No known injury.  Upon arrival, patient afebrile, not tachycardic or hypoxic.  Patient in no acute distress.  Physical exam significant for erythema to right great toe with  tenderness surrounding nailbed.  No purulent drainage.  No abscess. No I&D warranted at this time. X-ray ordered in triage which I personally reviewed and interpreted which is negative for any acute osseous abnormalities.  Does demonstrate soft tissue swelling about the medial aspect of the distal phalanx. Lower suspicion for osteomyelitis given pain and erythema just started yesterday, will hold on MRI at this time. Suspect infectious etiology.  Full range of motion of right great toe.  Low suspicion for septic joint.  No history of gout.  Will treat with antibiotics.  Patient has a podiatry appointment on Tuesday and advised to report to scheduled appointment for further evaluation. Strict ED precautions discussed with patient. Patient states understanding and agrees to plan. Patient discharged home in no acute distress and stable vitals        Final Clinical Impression(s) / ED Diagnoses Final diagnoses:  Toe infection    Rx / DC Orders ED Discharge Orders          Ordered    doxycycline (VIBRAMYCIN) 100 MG capsule  2 times daily        01/17/23 1229              Karie Kirks 01/17/23 1300    [place], [place], MD 01/17/23 1702

## 2023-01-17 NOTE — ED Triage Notes (Signed)
Pt here from home with c/o right big toe pain that started yesterday , pt does have a small red area to the bottom of that toe

## 2023-01-17 NOTE — Telephone Encounter (Signed)
Patient called she wants to know why you cancelled vascular test , she said she didn't have a seizure , these issues happen when she gets agitated. She wants to proceed with the test with the xanax. Please advise

## 2023-01-17 NOTE — ED Notes (Signed)
Patient verbalized understanding of discharge instructions and reasons to return to the ED 

## 2023-01-20 ENCOUNTER — Encounter (HOSPITAL_COMMUNITY): Payer: Medicare Other

## 2023-01-21 ENCOUNTER — Ambulatory Visit (INDEPENDENT_AMBULATORY_CARE_PROVIDER_SITE_OTHER): Payer: 59 | Admitting: Podiatry

## 2023-01-21 DIAGNOSIS — M7752 Other enthesopathy of left foot: Secondary | ICD-10-CM | POA: Diagnosis not present

## 2023-01-21 DIAGNOSIS — M7751 Other enthesopathy of right foot: Secondary | ICD-10-CM

## 2023-01-21 NOTE — Progress Notes (Signed)
Subjective:  Patient ID: Connie Brewer, female    DOB: Jan 30, 1961,  MRN: [id number]  Chief Complaint  Patient presents with   Toe Pain    Rm 24 Follow up from ER. Right great to infection per pt. Pt states there is some improvementi in appearance but still has stiffness. Pt is currently on antibiotics.     62 y.o. female presents with the above complaint. Patient presents with complaint of left hallux IPJ capsulitis.  Patient states the pain started coming back hurts with ambulation hurts with pressure denies any other acute complaints.   Review of Systems: Negative except as noted in the HPI. Denies N/V/F/Ch.  Past Medical History:  Diagnosis Date   Diabetes mellitus without complication (HCC)    Lupus ([place])     Current Outpatient Medications:    ALPRAZolam (XANAX) 0.5 MG tablet, SMARTSIG:1-2 Tablet(s) By Mouth, Disp: , Rfl:    ALPRAZolam (XANAX) 0.5 MG tablet, Take 1 tablet (0.5 mg total) by mouth 2 (two) times daily as needed for anxiety., Disp: 20 tablet, Rfl: 0   baclofen (LIORESAL) 10 MG tablet, Take 10 mg by mouth 3 (three) times daily., Disp: , Rfl:    Biotin 10 MG TABS, Take 10 mg by mouth daily., Disp: , Rfl:    Blood Glucose Monitoring Suppl (FIFTY50 GLUCOSE METER 2.0) w/Device KIT, See admin instructions., Disp: , Rfl:    buPROPion (WELLBUTRIN XL) 300 MG 24 hr tablet, Take 300 mg by mouth daily., Disp: , Rfl:    diclofenac Sodium (VOLTAREN) 1 % GEL, Apply 2 g topically 4 (four) times daily., Disp: , Rfl:    doxycycline (VIBRA-TABS) 100 MG tablet, Take 1 tablet (100 mg total) by mouth 2 (two) times daily., Disp: 10 tablet, Rfl: 0   doxycycline (VIBRAMYCIN) 100 MG capsule, Take 1 capsule (100 mg total) by mouth 2 (two) times daily., Disp: 20 capsule, Rfl: 0   glucose blood ([employer] BLOOD GLUCOSE TEST) test strip, Check blood sugar as directed once a day and for symptoms of high or low blood sugar., Disp: , Rfl:    HYDROcodone-acetaminophen (NORCO) 5-325 MG tablet, Take  1 tablet by mouth every 6 (six) hours as needed for moderate pain. (Patient not taking: Reported on 04/25/2020), Disp: 20 tablet, Rfl: 0   lidocaine (LIDODERM) 5 %, SMARTSIG:Topical, Disp: , Rfl:    metFORMIN (GLUCOPHAGE-XR) 500 MG 24 hr tablet, Take 1,000 mg by mouth 2 (two) times daily., Disp: , Rfl:    Methylcobalamin 1 MG CHEW, Take an over the counter vitamin B12 supplement daily, Disp: , Rfl:    Multiple Vitamin (MULTIVITAMIN) tablet, Take 1 tablet by mouth daily., Disp: , Rfl:    Multiple Vitamins-Minerals (WOMENS BONE HEALTH PO), Take 1 tablet by mouth daily., Disp: , Rfl:    naproxen (NAPROSYN) 500 MG tablet, Take 500 mg by mouth 2 (two) times daily., Disp: , Rfl:    nortriptyline (PAMELOR) 25 MG capsule, Take 50 mg by mouth at bedtime., Disp: , Rfl:    NUCYNTA ER 100 MG 12 hr tablet, Take 100 mg by mouth every 12 (twelve) hours., Disp: , Rfl:    ONETOUCH VERIO test strip, SMARTSIG:Strip(s) Via Meter Daily, Disp: , Rfl:    oxyCODONE-acetaminophen (PERCOCET) 5-325 MG tablet, Take 1-2 tablets by mouth every 4 (four) hours as needed for severe pain., Disp: 30 tablet, Rfl: 0   OZEMPIC, 1 MG/DOSE, 4 MG/3ML SOPN, Inject 1 mg into the skin once a week., Disp: , Rfl:    pregabalin (  LYRICA) 150 MG capsule, Take 150 mg by mouth 2 (two) times daily., Disp: , Rfl:    ramelteon (ROZEREM) 8 MG tablet, Take 8 mg by mouth at bedtime as needed., Disp: , Rfl:    ramelteon (ROZEREM) 8 MG tablet, Take by mouth., Disp: , Rfl:    Semaglutide, 1 MG/DOSE, (OZEMPIC, 1 MG/DOSE,) 4 MG/3ML SOPN, INJECT 1MG  UNDER THE SKIN EVERY 7 DAYS, Disp: , Rfl:    topiramate (TOPAMAX) 25 MG tablet, Take 25 mg by mouth daily., Disp: , Rfl:    traZODone (DESYREL) 50 MG tablet, Take 50 mg by mouth at bedtime as needed for sleep., Disp: , Rfl:    zinc gluconate 50 MG tablet, Take 50 mg by mouth daily., Disp: , Rfl:   Social History   Tobacco Use  Smoking Status Not on file  Smokeless Tobacco Not on file    No Known  Allergies Objective:   There were no vitals filed for this visit.  There is no height or weight on file to calculate BMI. Constitutional Well developed. Well nourished.  Vascular Dorsalis pedis pulses palpable bilaterally. Posterior tibial pulses palpable bilaterally. Capillary refill normal to all digits.  No cyanosis or clubbing noted. Pedal hair growth normal.  Neurologic Normal speech. Oriented to person, place, and time. Epicritic sensation to light touch grossly present bilaterally.  Dermatologic Nails well groomed and normal in appearance. No open wounds. No skin lesions.  Orthopedic: Pain on palpation to the IPJ joint of left hallux.  Mild pain with range of motion.  No deep intra-articular pain noted.  No pain at the metatarsophalangeal joint bilaterally.   Radiographs: None Assessment:   1. Capsulitis of toe, left   2. Capsulitis of toe, right     Plan:  Patient was evaluated and treated and all questions answered.  Vascular abnormality -Vascular status within normal limits.  No concern for vascular flow  Bilateral great toe pain -Clinically I believe patient will benefit from surgical shoe to try to offload it.  She may be putting excessive stress when she is ambulating. -Bilateral surgical shoe was given to the patient  No follow-ups on file.

## 2023-01-23 ENCOUNTER — Telehealth: Payer: Self-pay | Admitting: Podiatry

## 2023-01-23 NOTE — Telephone Encounter (Signed)
Patient is still having pain in in her left which has nerve damage and bone damage. She is unable to wear shoes. Also she is unable to keep the foot up to the knee warm even with socks on.   She has had special shoes made and its still not helping  Patient would like to know what to do next

## 2023-01-29 ENCOUNTER — Telehealth: Payer: Self-pay | Admitting: *Deleted

## 2023-01-29 NOTE — Telephone Encounter (Signed)
Spoke with patient concerning her pain,if she wants to referred to a pain management , said that she already has a pain management doctor(Dr Alvez in [place], has been going to her for 2 years). She is taking the antibiotic but the toe is still a little sensitive,explained to continuing taking antibiotic and if not better to call back in a few days,verbalized understanding, said ok.

## 2023-02-05 ENCOUNTER — Ambulatory Visit (INDEPENDENT_AMBULATORY_CARE_PROVIDER_SITE_OTHER): Payer: 59 | Admitting: Podiatry

## 2023-02-05 DIAGNOSIS — M7751 Other enthesopathy of right foot: Secondary | ICD-10-CM | POA: Diagnosis not present

## 2023-02-05 DIAGNOSIS — M7752 Other enthesopathy of left foot: Secondary | ICD-10-CM

## 2023-02-05 MED ORDER — DOXYCYCLINE HYCLATE 100 MG PO TABS: 100.0000 mg | ORAL_TABLET | Freq: Two times a day (BID) | ORAL | 0 refills | Status: AC

## 2023-02-05 NOTE — Progress Notes (Signed)
Subjective:  Patient ID: Connie Brewer, female    DOB: 07/21/1961,  MRN: [id number]  Chief Complaint  Patient presents with   Toe Pain    62 y.o. female presents with the above complaint. Patient presents with complaint of left hallux IPJ capsulitis.  Patient states that it continues to denies any other acute complaint would like to discuss next treatment plan   Review of Systems: Negative except as noted in the HPI. Denies N/V/F/Ch.  Past Medical History:  Diagnosis Date   Diabetes mellitus without complication (HCC)    Lupus ([place])     Current Outpatient Medications:    doxycycline (VIBRA-TABS) 100 MG tablet, Take 1 tablet (100 mg total) by mouth 2 (two) times daily for 10 days., Disp: 20 tablet, Rfl: 0   ALPRAZolam (XANAX) 0.5 MG tablet, SMARTSIG:1-2 Tablet(s) By Mouth, Disp: , Rfl:    ALPRAZolam (XANAX) 0.5 MG tablet, Take 1 tablet (0.5 mg total) by mouth 2 (two) times daily as needed for anxiety., Disp: 20 tablet, Rfl: 0   baclofen (LIORESAL) 10 MG tablet, Take 10 mg by mouth 3 (three) times daily., Disp: , Rfl:    Biotin 10 MG TABS, Take 10 mg by mouth daily., Disp: , Rfl:    Blood Glucose Monitoring Suppl (FIFTY50 GLUCOSE METER 2.0) w/Device KIT, See admin instructions., Disp: , Rfl:    buPROPion (WELLBUTRIN XL) 300 MG 24 hr tablet, Take 300 mg by mouth daily., Disp: , Rfl:    diclofenac Sodium (VOLTAREN) 1 % GEL, Apply 2 g topically 4 (four) times daily., Disp: , Rfl:    doxycycline (VIBRA-TABS) 100 MG tablet, Take 1 tablet (100 mg total) by mouth 2 (two) times daily., Disp: 10 tablet, Rfl: 0   doxycycline (VIBRAMYCIN) 100 MG capsule, Take 1 capsule (100 mg total) by mouth 2 (two) times daily., Disp: 20 capsule, Rfl: 0   glucose blood ([employer] BLOOD GLUCOSE TEST) test strip, Check blood sugar as directed once a day and for symptoms of high or low blood sugar., Disp: , Rfl:    HYDROcodone-acetaminophen (NORCO) 5-325 MG tablet, Take 1 tablet by mouth every 6 (six) hours as  needed for moderate pain. (Patient not taking: Reported on 04/25/2020), Disp: 20 tablet, Rfl: 0   lidocaine (LIDODERM) 5 %, SMARTSIG:Topical, Disp: , Rfl:    metFORMIN (GLUCOPHAGE-XR) 500 MG 24 hr tablet, Take 1,000 mg by mouth 2 (two) times daily., Disp: , Rfl:    Methylcobalamin 1 MG CHEW, Take an over the counter vitamin B12 supplement daily, Disp: , Rfl:    Multiple Vitamin (MULTIVITAMIN) tablet, Take 1 tablet by mouth daily., Disp: , Rfl:    Multiple Vitamins-Minerals (WOMENS BONE HEALTH PO), Take 1 tablet by mouth daily., Disp: , Rfl:    naproxen (NAPROSYN) 500 MG tablet, Take 500 mg by mouth 2 (two) times daily., Disp: , Rfl:    nortriptyline (PAMELOR) 25 MG capsule, Take 50 mg by mouth at bedtime., Disp: , Rfl:    NUCYNTA ER 100 MG 12 hr tablet, Take 100 mg by mouth every 12 (twelve) hours., Disp: , Rfl:    ONETOUCH VERIO test strip, SMARTSIG:Strip(s) Via Meter Daily, Disp: , Rfl:    oxyCODONE-acetaminophen (PERCOCET) 5-325 MG tablet, Take 1-2 tablets by mouth every 4 (four) hours as needed for severe pain., Disp: 30 tablet, Rfl: 0   OZEMPIC, 1 MG/DOSE, 4 MG/3ML SOPN, Inject 1 mg into the skin once a week., Disp: , Rfl:    pregabalin (LYRICA) 150 MG capsule, Take 150  mg by mouth 2 (two) times daily., Disp: , Rfl:    ramelteon (ROZEREM) 8 MG tablet, Take 8 mg by mouth at bedtime as needed., Disp: , Rfl:    ramelteon (ROZEREM) 8 MG tablet, Take by mouth., Disp: , Rfl:    Semaglutide, 1 MG/DOSE, (OZEMPIC, 1 MG/DOSE,) 4 MG/3ML SOPN, INJECT 1MG UNDER THE SKIN EVERY 7 DAYS, Disp: , Rfl:    topiramate (TOPAMAX) 25 MG tablet, Take 25 mg by mouth daily., Disp: , Rfl:    traZODone (DESYREL) 50 MG tablet, Take 50 mg by mouth at bedtime as needed for sleep., Disp: , Rfl:    zinc gluconate 50 MG tablet, Take 50 mg by mouth daily., Disp: , Rfl:   Social History   Tobacco Use  Smoking Status Not on file  Smokeless Tobacco Not on file    No Known Allergies Objective:   There were no vitals filed  for this visit.  There is no height or weight on file to calculate BMI. Constitutional Well developed. Well nourished.  Vascular Dorsalis pedis pulses palpable bilaterally. Posterior tibial pulses palpable bilaterally. Capillary refill normal to all digits.  No cyanosis or clubbing noted. Pedal hair growth normal.  Neurologic Normal speech. Oriented to person, place, and time. Epicritic sensation to light touch grossly present bilaterally.  Dermatologic Nails well groomed and normal in appearance. No open wounds. No skin lesions.  Orthopedic: Pain on palpation to the IPJ joint of left hallux.  Mild pain with range of motion.  No deep intra-articular pain noted.  No pain at the metatarsophalangeal joint bilaterally.   Radiographs: None Assessment:   No diagnosis found.   Plan:  Patient was evaluated and treated and all questions answered.  Vascular abnormality -Vascular status within normal limits.  No concern for vascular flow  Bilateral great toe pain -Clinically toe sleeves and toe protectors were dispensed to give her some relief and relieve some pressure.  She also mention to me that doxycycline helped in the past.  I will send her back again doxycycline to help in case if she develops any skin infection or abscess. No follow-ups on file.   Toe sleeves and toe protectors were dispensed prophylaxis doxycycline was dispensed

## 2023-02-06 ENCOUNTER — Telehealth: Payer: Self-pay | Admitting: *Deleted

## 2023-02-06 NOTE — Telephone Encounter (Signed)
[facility] is calling for clarification on 2 medications recently sent, which one to use, doxy-20 tablets or doxy-10 tablets, please advise.

## 2023-03-04 ENCOUNTER — Telehealth: Payer: Self-pay | Admitting: Podiatry

## 2023-03-04 NOTE — Telephone Encounter (Signed)
Pt called wanted to sched an appt w/ Dr. Posey Pronto, offered pt his next available, offered a sooner appt & she stated that was not soon enough. I offered appt w/ another provider & pt declined

## 2023-03-06 ENCOUNTER — Encounter (HOSPITAL_COMMUNITY): Payer: Self-pay

## 2023-03-06 ENCOUNTER — Emergency Department (HOSPITAL_COMMUNITY)
Admission: EM | Admit: 2023-03-06 | Discharge: 2023-03-06 | Disposition: A | Discharge status: Home / Self Care | Payer: 59 | Attending: Emergency Medicine | Admitting: Emergency Medicine

## 2023-03-06 ENCOUNTER — Emergency Department (HOSPITAL_COMMUNITY): Payer: 59

## 2023-03-06 ENCOUNTER — Other Ambulatory Visit: Payer: Self-pay

## 2023-03-06 ENCOUNTER — Ambulatory Visit: Payer: 59 | Admitting: Podiatry

## 2023-03-06 DIAGNOSIS — Y92810 Car as the place of occurrence of the external cause: Secondary | ICD-10-CM | POA: Diagnosis not present

## 2023-03-06 DIAGNOSIS — W01198A Fall on same level from slipping, tripping and stumbling with subsequent striking against other object, initial encounter: Secondary | ICD-10-CM | POA: Diagnosis not present

## 2023-03-06 DIAGNOSIS — S0990XA Unspecified injury of head, initial encounter: Secondary | ICD-10-CM | POA: Insufficient documentation

## 2023-03-06 MED ORDER — ACETAMINOPHEN 500 MG PO TABS
1000.0000 mg | ORAL_TABLET | Freq: Once | ORAL | Status: DC
  Filled 2023-03-06: qty 2

## 2023-03-06 MED ORDER — OXYCODONE HCL 5 MG PO TABS
5.0000 mg | ORAL_TABLET | Freq: Once | ORAL | Status: AC
  Administered 2023-03-06: 5 mg via ORAL
  Filled 2023-03-06: qty 1
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
## 2023-03-18 ENCOUNTER — Ambulatory Visit (INDEPENDENT_AMBULATORY_CARE_PROVIDER_SITE_OTHER): Payer: 59 | Admitting: Podiatry

## 2023-03-18 DIAGNOSIS — M7752 Other enthesopathy of left foot: Secondary | ICD-10-CM

## 2023-03-18 NOTE — Progress Notes (Signed)
Subjective:  Patient ID: Connie Brewer, female    DOB: Jul 05, 1961,  MRN: [id number]  Chief Complaint  Patient presents with   Foot Pain    62 y.o. female presents with the above complaint.  Patient presents with left ankle pain.  She states it is hurting came out of nowhere and little swollen.  She went to get it evaluated she wants to think a steroid shot.  Denies any other acute complaints.   Review of Systems: Negative except as noted in the HPI. Denies N/V/F/Ch.  Past Medical History:  Diagnosis Date   Diabetes mellitus without complication (HCC)    Lupus ([place])     Current Outpatient Medications:    ALPRAZolam (XANAX) 0.5 MG tablet, SMARTSIG:1-2 Tablet(s) By Mouth, Disp: , Rfl:    ALPRAZolam (XANAX) 0.5 MG tablet, Take 1 tablet (0.5 mg total) by mouth 2 (two) times daily as needed for anxiety., Disp: 20 tablet, Rfl: 0   baclofen (LIORESAL) 10 MG tablet, Take 10 mg by mouth 3 (three) times daily., Disp: , Rfl:    Biotin 10 MG TABS, Take 10 mg by mouth daily., Disp: , Rfl:    Blood Glucose Monitoring Suppl (FIFTY50 GLUCOSE METER 2.0) w/Device KIT, See admin instructions., Disp: , Rfl:    buPROPion (WELLBUTRIN XL) 300 MG 24 hr tablet, Take 300 mg by mouth daily., Disp: , Rfl:    diclofenac Sodium (VOLTAREN) 1 % GEL, Apply 2 g topically 4 (four) times daily., Disp: , Rfl:    doxycycline (VIBRA-TABS) 100 MG tablet, Take 1 tablet (100 mg total) by mouth 2 (two) times daily., Disp: 10 tablet, Rfl: 0   doxycycline (VIBRAMYCIN) 100 MG capsule, Take 1 capsule (100 mg total) by mouth 2 (two) times daily., Disp: 20 capsule, Rfl: 0   glucose blood ([employer] BLOOD GLUCOSE TEST) test strip, Check blood sugar as directed once a day and for symptoms of high or low blood sugar., Disp: , Rfl:    HYDROcodone-acetaminophen (NORCO) 5-325 MG tablet, Take 1 tablet by mouth every 6 (six) hours as needed for moderate pain. (Patient not taking: Reported on 04/25/2020), Disp: 20 tablet, Rfl: 0   lidocaine  (LIDODERM) 5 %, SMARTSIG:Topical, Disp: , Rfl:    metFORMIN (GLUCOPHAGE-XR) 500 MG 24 hr tablet, Take 1,000 mg by mouth 2 (two) times daily., Disp: , Rfl:    Methylcobalamin 1 MG CHEW, Take an over the counter vitamin B12 supplement daily, Disp: , Rfl:    Multiple Vitamin (MULTIVITAMIN) tablet, Take 1 tablet by mouth daily., Disp: , Rfl:    Multiple Vitamins-Minerals (WOMENS BONE HEALTH PO), Take 1 tablet by mouth daily., Disp: , Rfl:    naproxen (NAPROSYN) 500 MG tablet, Take 500 mg by mouth 2 (two) times daily., Disp: , Rfl:    nortriptyline (PAMELOR) 25 MG capsule, Take 50 mg by mouth at bedtime., Disp: , Rfl:    NUCYNTA ER 100 MG 12 hr tablet, Take 100 mg by mouth every 12 (twelve) hours., Disp: , Rfl:    ONETOUCH VERIO test strip, SMARTSIG:Strip(s) Via Meter Daily, Disp: , Rfl:    oxyCODONE-acetaminophen (PERCOCET) 5-325 MG tablet, Take 1-2 tablets by mouth every 4 (four) hours as needed for severe pain., Disp: 30 tablet, Rfl: 0   OZEMPIC, 1 MG/DOSE, 4 MG/3ML SOPN, Inject 1 mg into the skin once a week., Disp: , Rfl:    pregabalin (LYRICA) 150 MG capsule, Take 150 mg by mouth 2 (two) times daily., Disp: , Rfl:    ramelteon (ROZEREM)  8 MG tablet, Take 8 mg by mouth at bedtime as needed., Disp: , Rfl:    ramelteon (ROZEREM) 8 MG tablet, Take by mouth., Disp: , Rfl:    Semaglutide, 1 MG/DOSE, (OZEMPIC, 1 MG/DOSE,) 4 MG/3ML SOPN, INJECT 1MG  UNDER THE SKIN EVERY 7 DAYS, Disp: , Rfl:    topiramate (TOPAMAX) 25 MG tablet, Take 25 mg by mouth daily., Disp: , Rfl:    traZODone (DESYREL) 50 MG tablet, Take 50 mg by mouth at bedtime as needed for sleep., Disp: , Rfl:    zinc gluconate 50 MG tablet, Take 50 mg by mouth daily., Disp: , Rfl:   Social History   Tobacco Use  Smoking Status Not on file  Smokeless Tobacco Not on file    No Known Allergies Objective:  There were no vitals filed for this visit. There is no height or weight on file to calculate BMI. Constitutional Well developed. Well  nourished.  Vascular Dorsalis pedis pulses palpable bilaterally. Posterior tibial pulses palpable bilaterally. Capillary refill normal to all digits.  No cyanosis or clubbing noted. Pedal hair growth normal.  Neurologic Normal speech. Oriented to person, place, and time. Epicritic sensation to light touch grossly present bilaterally.  Dermatologic Nails well groomed and normal in appearance. No open wounds. No skin lesions.  Orthopedic: Pain on palpation left ankle.  Edema noted circumferential around the ankle.  Mild pain on palpation.   Radiographs: None Assessment:   1. Capsulitis of ankle, left    Plan:  Patient was evaluated and treated and all questions answered.  Left ankle capsulitis -All questions and concerns were discussed with the patient in extensive detail. -Given the amount of pain that she is having the ankle she will benefit from steroid injection patient agrees with plan like to proceed with steroid injection -A steroid injection was performed at left ankle using 1% plain Lidocaine and 10 mg of Kenalog. This was well tolerated.   No follow-ups on file.

## 2023-11-14 ENCOUNTER — Encounter: Payer: Self-pay | Admitting: Podiatry

## 2023-11-14 ENCOUNTER — Ambulatory Visit (INDEPENDENT_AMBULATORY_CARE_PROVIDER_SITE_OTHER): Payer: 59 | Admitting: Podiatry

## 2023-11-14 DIAGNOSIS — M7752 Other enthesopathy of left foot: Secondary | ICD-10-CM | POA: Diagnosis not present

## 2023-11-14 NOTE — Progress Notes (Signed)
Subjective:  Patient ID: Connie Brewer, female    DOB: [birth year],  MRN: [id number]  Chief Complaint  Patient presents with   Toe Pain    PATIENT STATES SHE IS HAVE LEFT HALLUX  TOE PAIN , FOR ABOUT A COUPLE MONTHS     62 y.o. female presents with the above complaint.  Left greatPatient presents with hallux IPJ capsulitis.  She states this time her again she would like an injection denies any other acute complaints  Review of Systems: Negative except as noted in the HPI. Denies N/V/F/Ch.  Past Medical History:  Diagnosis Date   Diabetes mellitus without complication (HCC)    Lupus     Current Outpatient Medications:    ALPRAZolam (XANAX) 0.5 MG tablet, SMARTSIG:1-2 Tablet(s) By Mouth, Disp: , Rfl:    ALPRAZolam (XANAX) 0.5 MG tablet, Take 1 tablet (0.5 mg total) by mouth 2 (two) times daily as needed for anxiety., Disp: 20 tablet, Rfl: 0   baclofen (LIORESAL) 10 MG tablet, Take 10 mg by mouth 3 (three) times daily., Disp: , Rfl:    Biotin 10 MG TABS, Take 10 mg by mouth daily., Disp: , Rfl:    Blood Glucose Monitoring Suppl (FIFTY50 GLUCOSE METER 2.0) w/Device KIT, See admin instructions., Disp: , Rfl:    buPROPion (WELLBUTRIN XL) 300 MG 24 hr tablet, Take 300 mg by mouth daily., Disp: , Rfl:    diclofenac Sodium (VOLTAREN) 1 % GEL, Apply 2 g topically 4 (four) times daily., Disp: , Rfl:    doxycycline (VIBRA-TABS) 100 MG tablet, Take 1 tablet (100 mg total) by mouth 2 (two) times daily., Disp: 10 tablet, Rfl: 0   doxycycline (VIBRAMYCIN) 100 MG capsule, Take 1 capsule (100 mg total) by mouth 2 (two) times daily., Disp: 20 capsule, Rfl: 0   glucose blood ([employer] BLOOD GLUCOSE TEST) test strip, Check blood sugar as directed once a day and for symptoms of high or low blood sugar., Disp: , Rfl:    HYDROcodone-acetaminophen (NORCO) 5-325 MG tablet, Take 1 tablet by mouth every 6 (six) hours as needed for moderate pain., Disp: 20 tablet, Rfl: 0   lidocaine (LIDODERM) 5 %,  SMARTSIG:Topical, Disp: , Rfl:    metFORMIN (GLUCOPHAGE-XR) 500 MG 24 hr tablet, Take 1,000 mg by mouth 2 (two) times daily., Disp: , Rfl:    Methylcobalamin 1 MG CHEW, Take an over the counter vitamin B12 supplement daily, Disp: , Rfl:    Multiple Vitamin (MULTIVITAMIN) tablet, Take 1 tablet by mouth daily., Disp: , Rfl:    Multiple Vitamins-Minerals (WOMENS BONE HEALTH PO), Take 1 tablet by mouth daily., Disp: , Rfl:    naproxen (NAPROSYN) 500 MG tablet, Take 500 mg by mouth 2 (two) times daily., Disp: , Rfl:    nortriptyline (PAMELOR) 25 MG capsule, Take 50 mg by mouth at bedtime., Disp: , Rfl:    NUCYNTA ER 100 MG 12 hr tablet, Take 100 mg by mouth every 12 (twelve) hours., Disp: , Rfl:    ONETOUCH VERIO test strip, SMARTSIG:Strip(s) Via Meter Daily, Disp: , Rfl:    oxyCODONE-acetaminophen (PERCOCET) 5-325 MG tablet, Take 1-2 tablets by mouth every 4 (four) hours as needed for severe pain., Disp: 30 tablet, Rfl: 0   OZEMPIC, 1 MG/DOSE, 4 MG/3ML SOPN, Inject 1 mg into the skin once a week., Disp: , Rfl:    pregabalin (LYRICA) 150 MG capsule, Take 150 mg by mouth 2 (two) times daily., Disp: , Rfl:    ramelteon (ROZEREM) 8 MG tablet,  Take 8 mg by mouth at bedtime as needed., Disp: , Rfl:    Semaglutide, 1 MG/DOSE, (OZEMPIC, 1 MG/DOSE,) 4 MG/3ML SOPN, INJECT 1MG  UNDER THE SKIN EVERY 7 DAYS, Disp: , Rfl:    topiramate (TOPAMAX) 25 MG tablet, Take 25 mg by mouth daily., Disp: , Rfl:    traZODone (DESYREL) 50 MG tablet, Take 50 mg by mouth at bedtime as needed for sleep., Disp: , Rfl:    zinc gluconate 50 MG tablet, Take 50 mg by mouth daily., Disp: , Rfl:    ramelteon (ROZEREM) 8 MG tablet, Take by mouth., Disp: , Rfl:   Social History   Tobacco Use  Smoking Status Never  Smokeless Tobacco Never    No Known Allergies Objective:  There were no vitals filed for this visit. There is no height or weight on file to calculate BMI. Constitutional Well developed. Well nourished.  Vascular  Dorsalis pedis pulses palpable bilaterally. Posterior tibial pulses palpable bilaterally. Capillary refill normal to all digits.  No cyanosis or clubbing noted. Pedal hair growth normal.  Neurologic Normal speech. Oriented to person, place, and time. Epicritic sensation to light touch grossly present bilaterally.  Dermatologic Nails well groomed and normal in appearance. No open wounds. No skin lesions.  Orthopedic: Pain on palpation to the IPJ joint of bilateral hallux.  Mild pain with range of motion.  No deep intra-articular pain noted.  No pain at the metatarsophalangeal joint bilaterally.   Radiographs: None Assessment:   No diagnosis found.  Plan:  Patient was evaluated and treated and all questions answered.  -Bilateral hallux IPJ capsulitis -All questions and concerns were discussed with the patient in extensive detail -Given the amount of pain that he is experiencing he will benefit from a steroid injection to help decrease acute inflammatory component associate with pain.  Patient agrees with the plan like to proceed with steroid injection. -A steroid injection was performed at bilateral interphalangeal joint hallux using 1% plain Lidocaine and 10 mg of Kenalog. This was well tolerated.  No follow-ups on file.

## 2023-11-18 ENCOUNTER — Telehealth: Payer: Self-pay | Admitting: Podiatry

## 2023-11-18 NOTE — Telephone Encounter (Signed)
Patient called stating that she cam in on the 15 and her feet have swollen to twice the size now and she is unsure on what she is to do. Please call and advise her she is real concerned and stated this has never happened before  Thanks !

## 2024-07-03 ENCOUNTER — Other Ambulatory Visit: Payer: Self-pay

## 2024-07-03 ENCOUNTER — Encounter (HOSPITAL_COMMUNITY): Payer: Self-pay | Admitting: *Deleted

## 2024-07-03 ENCOUNTER — Emergency Department (HOSPITAL_COMMUNITY): Admission: EM | Admit: 2024-07-03 | Discharge: 2024-07-03 | Disposition: A | Discharge status: Home / Self Care

## 2024-07-03 DIAGNOSIS — S00261A Insect bite (nonvenomous) of right eyelid and periocular area, initial encounter: Secondary | ICD-10-CM | POA: Diagnosis present

## 2024-07-03 DIAGNOSIS — W57XXXA Bitten or stung by nonvenomous insect and other nonvenomous arthropods, initial encounter: Secondary | ICD-10-CM | POA: Insufficient documentation

## 2024-07-03 MED ORDER — DEXAMETHASONE 4 MG PO TABS
4.0000 mg | ORAL_TABLET | Freq: Once | ORAL | Status: AC
  Administered 2024-07-03: 4 mg via ORAL
  Filled 2024-07-03: qty 1

## 2024-07-03 MED ORDER — DIPHENHYDRAMINE HCL 25 MG PO CAPS
50.0000 mg | ORAL_CAPSULE | Freq: Once | ORAL | Status: AC
  Administered 2024-07-03: 50 mg via ORAL
  Filled 2024-07-03: qty 2

## 2024-07-03 NOTE — Discharge Instructions (Addendum)
 May continue to put ice on the the sting.  May take Benadryl  this evening before bed if you continue to have itching.  Return if develop fevers, chills, severe eye pain, pain with moving your eyes.  Vision loss, lips become swollen, difficulty swallowing, difficulty breathing, chest pain or any new or worsening symptoms that are concerning to you.

## 2024-07-03 NOTE — ED Provider Notes (Signed)
 [place] [facility] AT [facility] Provider Note   CSN: [id number] Arrival date & time: 07/03/24  1157     Patient presents with: Insect Bite   Connie Brewer is a 63 y.o. female.   63 year old presenting emergency department after being stung/bit on right cheek just below eyelid.  Notes pain to the area and swelling around her right eye.  No vision changes.  No pain with EOM.  No proptosis.  No angioedema, difficulty swallowing or difficulty breathing.        Prior to Admission medications   Medication Sig Start Date End Date Taking? Authorizing Provider  ALPRAZolam  (XANAX ) 0.5 MG tablet SMARTSIG:1-2 Tablet(s) By Mouth 04/17/21   [provider]  ALPRAZolam  (XANAX ) 0.5 MG tablet Take 1 tablet (0.5 mg total) by mouth 2 (two) times daily as needed for anxiety. 01/14/23   Tobie Franky SQUIBB, DPM  baclofen (LIORESAL) 10 MG tablet Take 10 mg by mouth 3 (three) times daily. 04/11/20   [provider]  Biotin 10 MG TABS Take 10 mg by mouth daily.    [provider]  Blood Glucose Monitoring Suppl (FIFTY50 GLUCOSE METER 2.0) w/Device KIT See admin instructions. 04/28/20   [provider]  buPROPion (WELLBUTRIN XL) 300 MG 24 hr tablet Take 300 mg by mouth daily. 02/26/20   [provider]  diclofenac Sodium (VOLTAREN) 1 % GEL Apply 2 g topically 4 (four) times daily. 02/18/20   [provider]  doxycycline  (VIBRA -TABS) 100 MG tablet Take 1 tablet (100 mg total) by mouth 2 (two) times daily. 02/05/23   Tobie Franky SQUIBB, DPM  doxycycline  (VIBRAMYCIN ) 100 MG capsule Take 1 capsule (100 mg total) by mouth 2 (two) times daily. 01/17/23   Lorelle Aleck BROCKS, PA-C  glucose blood ([employer] BLOOD GLUCOSE TEST) test strip Check blood sugar as directed once a day and for symptoms of high or low blood sugar. 01/24/21   [provider]  HYDROcodone -acetaminophen  (NORCO) 5-325 MG tablet Take 1 tablet by mouth every 6 (six) hours as  needed for moderate pain. 08/24/16   Charlene Debby BROCKS, PA-C  lidocaine (LIDODERM) 5 % SMARTSIG:Topical 11/24/20   [provider]  metFORMIN (GLUCOPHAGE-XR) 500 MG 24 hr tablet Take 1,000 mg by mouth 2 (two) times daily. 11/30/19   [provider]  Methylcobalamin 1 MG CHEW Take an over the counter vitamin B12 supplement daily 06/01/20   [provider]  Multiple Vitamin (MULTIVITAMIN) tablet Take 1 tablet by mouth daily.    [provider]  Multiple Vitamins-Minerals (WOMENS BONE HEALTH PO) Take 1 tablet by mouth daily.    [provider]  naproxen (NAPROSYN) 500 MG tablet Take 500 mg by mouth 2 (two) times daily. 03/30/20   [provider]  nortriptyline (PAMELOR) 25 MG capsule Take 50 mg by mouth at bedtime. 04/04/20   [provider]  NUCYNTA ER 100 MG 12 hr tablet Take 100 mg by mouth every 12 (twelve) hours. 03/30/20   [provider]  AISHA SINKS test strip SMARTSIG:Strip(s) Via Meter Daily 01/24/21   [provider]  oxyCODONE -acetaminophen  (PERCOCET) 5-325 MG tablet Take 1-2 tablets by mouth every 4 (four) hours as needed for severe pain. 03/09/21   Tobie Franky SQUIBB, DPM  OZEMPIC, 1 MG/DOSE, 4 MG/3ML SOPN Inject 1 mg into the skin once a week. 04/17/21   [provider]  pregabalin (LYRICA) 150 MG capsule Take 150 mg by mouth 2 (two) times daily. 03/20/20  [provider]  ramelteon (ROZEREM) 8 MG tablet Take 8 mg by mouth at bedtime as needed. 03/17/21   [provider]  ramelteon (ROZEREM) 8 MG tablet Take by mouth. 12/18/20 12/18/21  [provider]  Semaglutide, 1 MG/DOSE, (OZEMPIC, 1 MG/DOSE,) 4 MG/3ML SOPN INJECT 1MG  UNDER THE SKIN EVERY 7 DAYS 04/17/21   [provider]  topiramate (TOPAMAX) 25 MG tablet Take 25 mg by mouth daily.    [provider]  traZODone (DESYREL) 50 MG tablet Take 50 mg by mouth at bedtime as needed for sleep. 03/30/20   [provider]  zinc gluconate 50 MG tablet Take 50 mg by mouth daily.    [provider]    Allergies: Patient has no known allergies.    Review of Systems  Updated Vital Signs BP (!) 114/58 (BP Location: Right Arm)   Pulse 71   Temp 98.2 F (36.8 C) (Oral)   Resp 18   Ht 5' 2 (1.575 m)   Wt 83.9 kg   SpO2 99%   BMI 33.84 kg/m   Physical Exam Vitals and nursing note reviewed.  Constitutional:      General: She is not in acute distress.    Appearance: She is not toxic-appearing.  HENT:     Head: Normocephalic and atraumatic.     Right Ear: Tympanic membrane normal.     Left Ear: Tympanic membrane normal.     Nose: Nose normal.     Mouth/Throat:     Mouth: Mucous membranes are moist.  Eyes:     Extraocular Movements: Extraocular movements intact.     Pupils: Pupils are equal, round, and reactive to light.     Comments: Does have some minor periorbital swelling to the right eye.  Normal vision with eyelids are held open.  Full EOM.  No chemosis or ecchymosis.  No proptosis.  Cardiovascular:     Rate and Rhythm: Normal rate and regular rhythm.  Pulmonary:     Effort: Pulmonary effort is normal.     Breath sounds: No wheezing.  Abdominal:     General: Abdomen is flat. There is no distension.     Tenderness: There is no abdominal tenderness. There is no guarding or rebound.  Musculoskeletal:        General: Normal range of motion.  Skin:    Capillary Refill: Capillary refill takes less than 2 seconds.     Findings: No rash.  Neurological:     Mental Status: She is alert and oriented to person, place, and time.  Psychiatric:        Mood and Affect: Mood normal.        Behavior: Behavior normal.     (all labs ordered are listed, but only abnormal results are displayed) Labs Reviewed - No data to display  EKG: None  Radiology: No results found.   Procedures   Medications Ordered in the ED  diphenhydrAMINE  (BENADRYL ) capsule 50 mg (has no administration in  time range)  dexamethasone  (DECADRON ) tablet 4 mg (has no administration in time range)                                    Medical Decision Making This is a well-appearing 63 year old female presenting emergency department with bug bite/sting to right cheek.  She is afebrile vital signs reviewed and reassuring.  Does not appear to be having an acute  anaphylactic reaction, but does have minor localized allergic response.  Given Benadryl  and Decadron  here.  Discussed watchful waiting and strict return precautions.  Stable for discharge.  Risk Prescription drug management.      Final diagnoses:  None    ED Discharge Orders     None          Neysa Caron PARAS, DO 07/03/24 1258

## 2024-07-03 NOTE — ED Triage Notes (Signed)
 Here by POV from home for R eye bee sting with associated pain, and swelling. Denies itching, sob, rash, throat or airway involvement. Seen by EMS at seen and declined transport. Alert, NAD, calm, interactive, speech clear.

## 2024-07-05 ENCOUNTER — Other Ambulatory Visit: Payer: Self-pay

## 2024-07-05 ENCOUNTER — Emergency Department (HOSPITAL_COMMUNITY)
Admission: EM | Admit: 2024-07-05 | Discharge: 2024-07-06 | Discharge status: Against Medical Advice | Source: Ambulatory Visit | Attending: Emergency Medicine | Admitting: Emergency Medicine

## 2024-07-05 ENCOUNTER — Encounter (HOSPITAL_COMMUNITY): Payer: Self-pay

## 2024-07-05 DIAGNOSIS — H538 Other visual disturbances: Secondary | ICD-10-CM | POA: Insufficient documentation

## 2024-07-05 DIAGNOSIS — Z5321 Procedure and treatment not carried out due to patient leaving prior to being seen by health care provider: Secondary | ICD-10-CM | POA: Diagnosis not present

## 2024-07-05 DIAGNOSIS — H5711 Ocular pain, right eye: Secondary | ICD-10-CM | POA: Diagnosis present

## 2024-07-05 DIAGNOSIS — R519 Headache, unspecified: Secondary | ICD-10-CM | POA: Insufficient documentation

## 2024-07-05 MED ORDER — FLUORESCEIN SODIUM 1 MG OP STRP: 1.0000 | ORAL_STRIP | Freq: Once | OPHTHALMIC | Status: DC

## 2024-07-05 MED ORDER — TETRACAINE HCL 0.5 % OP SOLN: 2.0000 [drp] | Freq: Once | OPHTHALMIC | Status: DC

## 2024-07-05 NOTE — ED Triage Notes (Signed)
 Pt reports recent bee sting under R eye. Pt reports pain, swelling, blurry vision, and headache associated with sting.

## 2024-07-05 NOTE — ED Provider Triage Note (Signed)
 Emergency Medicine Provider Triage Evaluation Note  Connie Brewer , a 63 y.o. female  was evaluated in triage.  Pt complains of right Perry ocular pain.  Patient was seen here a few days prior for insect sting to the lower right lid.  Was struck to take Benadryl  however did not given that she reports it was not itching.  Reports she has been having some pain and is now developed a frontal headache.  Was seen by her primary care doctor apparently few days prior and was told to return to the emerged from and immediately.  She reports that she has been having some blurry vision to the right eye however has pain in it and does not want to open it.  Denies any fevers.  Review of Systems  Positive:  Negative:   Physical Exam  BP (!) 131/58 (BP Location: Left Arm)   Pulse 76   Temp (!) 97.5 F (36.4 C) (Oral)   Resp 17   SpO2 100%  Gen:   Awake, no distress, on phone    Resp:  Normal effort  MSK:   Moves extremities without difficulty  Other:  Minimal swelling present to the lower right lid.   Medical Decision Making  Medically screening exam initiated at 7:53 PM.  Appropriate orders placed.  Connie Brewer was informed that the remainder of the evaluation will be completed by another provider, this initial triage assessment does not replace that evaluation, and the importance of remaining in the ED until their evaluation is complete.  Patient will need fluorescence stain   Bernis Ernst, PA-C 07/05/24 1954
